# Patient Record
Sex: Male | Born: 1995 | Race: Black or African American | Hispanic: No | Marital: Single | Smoking: Never smoker
Health system: Southern US, Community
[De-identification: ages and names within clinical notes are randomized; demographics above are authoritative.]

## PROBLEM LIST (undated history)

## (undated) DIAGNOSIS — J45909 Unspecified asthma, uncomplicated: Secondary | ICD-10-CM

## (undated) DIAGNOSIS — J302 Other seasonal allergic rhinitis: Secondary | ICD-10-CM

---
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
## 2015-09-23 ENCOUNTER — Encounter (HOSPITAL_COMMUNITY): Payer: Self-pay | Admitting: Emergency Medicine

## 2015-09-23 ENCOUNTER — Emergency Department (HOSPITAL_COMMUNITY)
Admission: EM | Admit: 2015-09-23 | Discharge: 2015-09-24 | Disposition: A | Discharge status: Home / Self Care | Payer: Medicaid Other | Attending: Emergency Medicine | Admitting: Emergency Medicine

## 2015-09-23 ENCOUNTER — Emergency Department (HOSPITAL_COMMUNITY)
Admission: EM | Admit: 2015-09-23 | Discharge: 2015-09-23 | Discharge status: Against Medical Advice | Payer: Medicaid Other | Attending: Emergency Medicine | Admitting: Emergency Medicine

## 2015-09-23 ENCOUNTER — Emergency Department (HOSPITAL_COMMUNITY): Payer: Self-pay

## 2015-09-23 ENCOUNTER — Encounter (HOSPITAL_COMMUNITY): Payer: Self-pay

## 2015-09-23 DIAGNOSIS — R1084 Generalized abdominal pain: Secondary | ICD-10-CM | POA: Insufficient documentation

## 2015-09-23 DIAGNOSIS — J45909 Unspecified asthma, uncomplicated: Secondary | ICD-10-CM | POA: Insufficient documentation

## 2015-09-23 DIAGNOSIS — R110 Nausea: Secondary | ICD-10-CM | POA: Insufficient documentation

## 2015-09-23 DIAGNOSIS — R630 Anorexia: Secondary | ICD-10-CM | POA: Insufficient documentation

## 2015-09-23 DIAGNOSIS — R1031 Right lower quadrant pain: Secondary | ICD-10-CM | POA: Insufficient documentation

## 2015-09-23 DIAGNOSIS — R000 Tachycardia, unspecified: Secondary | ICD-10-CM | POA: Insufficient documentation

## 2015-09-23 DIAGNOSIS — R1032 Left lower quadrant pain: Secondary | ICD-10-CM | POA: Insufficient documentation

## 2015-09-23 DIAGNOSIS — M545 Low back pain: Secondary | ICD-10-CM | POA: Insufficient documentation

## 2015-09-23 LAB — CBC WITH DIFFERENTIAL/PLATELET
Basophils Absolute: 0 10*3/uL (ref 0.0–0.1)
Basophils Relative: 0 %
EOS ABS: 0 10*3/uL (ref 0.0–0.7)
EOS PCT: 0 %
HCT: 38.5 % — ABNORMAL LOW (ref 39.0–52.0)
Hemoglobin: 13.5 g/dL (ref 13.0–17.0)
LYMPHS ABS: 1 10*3/uL (ref 0.7–4.0)
LYMPHS PCT: 7 %
MCH: 29.3 pg (ref 26.0–34.0)
MCHC: 35.1 g/dL (ref 30.0–36.0)
MCV: 83.7 fL (ref 78.0–100.0)
MONO ABS: 1.4 10*3/uL — AB (ref 0.1–1.0)
MONOS PCT: 9 %
Neutro Abs: 13.1 10*3/uL — ABNORMAL HIGH (ref 1.7–7.7)
Neutrophils Relative %: 84 %
PLATELETS: 225 10*3/uL (ref 150–400)
RBC: 4.6 MIL/uL (ref 4.22–5.81)
RDW: 12.6 % (ref 11.5–15.5)
WBC: 15.5 10*3/uL — ABNORMAL HIGH (ref 4.0–10.5)

## 2015-09-23 LAB — COMPREHENSIVE METABOLIC PANEL
ALBUMIN: 4.1 g/dL (ref 3.5–5.0)
ALK PHOS: 57 U/L (ref 38–126)
ALT: 34 U/L (ref 17–63)
AST: 19 U/L (ref 15–41)
Anion gap: 11 (ref 5–15)
BILIRUBIN TOTAL: 1.3 mg/dL — AB (ref 0.3–1.2)
BUN: 8 mg/dL (ref 6–20)
CALCIUM: 9.4 mg/dL (ref 8.9–10.3)
CO2: 29 mmol/L (ref 22–32)
Chloride: 100 mmol/L — ABNORMAL LOW (ref 101–111)
Creatinine, Ser: 0.92 mg/dL (ref 0.61–1.24)
GFR calc Af Amer: >60 mL/min (ref >60)
GFR calc non Af Amer: >60 mL/min (ref >60)
GLUCOSE: 91 mg/dL (ref 65–99)
Potassium: 3.4 mmol/L — ABNORMAL LOW (ref 3.5–5.1)
SODIUM: 140 mmol/L (ref 135–145)
TOTAL PROTEIN: 6.8 g/dL (ref 6.5–8.1)

## 2015-09-23 LAB — CBC
HCT: 39.3 % (ref 39.0–52.0)
HEMOGLOBIN: 13.3 g/dL (ref 13.0–17.0)
MCH: 28.5 pg (ref 26.0–34.0)
MCHC: 33.8 g/dL (ref 30.0–36.0)
MCV: 84.3 fL (ref 78.0–100.0)
PLATELETS: 215 10*3/uL (ref 150–400)
RBC: 4.66 MIL/uL (ref 4.22–5.81)
RDW: 12.7 % (ref 11.5–15.5)
WBC: 14.7 10*3/uL — ABNORMAL HIGH (ref 4.0–10.5)

## 2015-09-23 LAB — I-STAT CHEM 8, ED
BUN: 10 mg/dL (ref 6–20)
CHLORIDE: 99 mmol/L — AB (ref 101–111)
CREATININE: 0.7 mg/dL (ref 0.61–1.24)
Calcium, Ion: 1.12 mmol/L (ref 1.12–1.23)
GLUCOSE: 84 mg/dL (ref 65–99)
HCT: 44 % (ref 39.0–52.0)
Hemoglobin: 15 g/dL (ref 13.0–17.0)
Potassium: 3.4 mmol/L — ABNORMAL LOW (ref 3.5–5.1)
SODIUM: 140 mmol/L (ref 135–145)
TCO2: 28 mmol/L (ref 0–100)

## 2015-09-23 LAB — LIPASE, BLOOD: Lipase: 23 U/L (ref 11–51)

## 2015-09-23 MED ORDER — SODIUM CHLORIDE 0.9 % IV BOLUS (SEPSIS)
1000.0000 mL | Freq: Once | INTRAVENOUS | Status: AC
  Administered 2015-09-23: 1000 mL via INTRAVENOUS

## 2015-09-23 MED ORDER — IOHEXOL 300 MG/ML  SOLN
25.0000 mL | Freq: Once | INTRAMUSCULAR | Status: AC | PRN
  Administered 2015-09-23: 25 mL via ORAL

## 2015-09-23 MED ORDER — MORPHINE SULFATE (PF) 4 MG/ML IV SOLN
4.0000 mg | Freq: Once | INTRAVENOUS | Status: AC
  Administered 2015-09-24: 4 mg via INTRAVENOUS
  Filled 2015-09-23: qty 1

## 2015-09-23 MED ORDER — IOHEXOL 300 MG/ML  SOLN
100.0000 mL | Freq: Once | INTRAMUSCULAR | Status: AC | PRN
  Administered 2015-09-24: 100 mL via INTRAVENOUS

## 2015-09-23 MED ORDER — ONDANSETRON HCL 4 MG/2ML IJ SOLN
4.0000 mg | Freq: Once | INTRAMUSCULAR | Status: AC
  Administered 2015-09-23: 4 mg via INTRAVENOUS
  Filled 2015-09-23: qty 2

## 2015-09-23 NOTE — ED Notes (Signed)
Pt states that he has generalized abdominal pain, lower back pain and bilateral calf pain. Nausea. Denies vomiting or diarrhea. Alert and oriented.

## 2015-09-23 NOTE — ED Provider Notes (Signed)
CSN: [id number]     Arrival date & time 09/23/15  1917 History   First MD Initiated Contact with Patient 09/23/15 2223     Chief Complaint  Patient presents with  . Abdominal Pain     (Consider location/radiation/quality/duration/timing/severity/associated sxs/prior Treatment) HPI   20 year old male here with generalized abdominal pain.  Pt report having lower abd pain radiates to his lower back along with bilateral calf pain since earlier today.  Report feeling nauseous without vomiting or diarrhea.  Report tactile fever without congestion/cough or URI sxs.  No dysuria.  Pt notice urine slightly darker but no cola color.  Report decreased in appetite. Pt took a percocet prior to arrival which has helped.  Pain is currently mild at this time. No penile discharge or hematuria. No recent strenuous activities.  Past Medical History  Diagnosis Date  . Asthma   . Seasonal allergies    History reviewed. No pertinent past surgical history. History reviewed. No pertinent family history. Social History  Substance Use Topics  . Smoking status: Never Smoker   . Smokeless tobacco: None  . Alcohol Use: No    Review of Systems  All other systems reviewed and are negative.     Allergies  Shellfish allergy and Ibuprofen  Home Medications   Prior to Admission medications   Not on File   BP 133/78 mmHg  Pulse 110  Temp(Src) 100 F (37.8 C) (Oral)  Resp 18  SpO2 100% Physical Exam  Constitutional: He appears well-developed and well-nourished. No distress.  HENT:  Head: Atraumatic.  Mouth/Throat: Oropharynx is clear and moist.  Eyes: Conjunctivae are normal.  Neck: Neck supple.  Cardiovascular:  Tachycardia without murmur rubs or gallops  Pulmonary/Chest: Effort normal and breath sounds normal. No respiratory distress. He has no wheezes. He has no rales.  Abdominal: Soft. Bowel sounds are normal. He exhibits no distension. There is tenderness (Mild tenderness to lower abdomen  without focal point tenderness. Negative Murphy sign, no pain at McBurney's point.).  Genitourinary:  Chaperone present during exam. No inguinal lymphadenopathy or hernia noted. Normal circumcised penis free of lesion or rash. Testicles nontender.  Neurological: He is alert.  Skin: No rash noted.  Psychiatric: He has a normal mood and affect.  Nursing note and vitals reviewed.   ED Course  Procedures (including critical care time) Labs Review Labs Reviewed  CBC WITH DIFFERENTIAL/PLATELET - Abnormal; Notable for the following:    WBC 15.5 (*)    HCT 38.5 (*)    Neutro Abs 13.1 (*)    Monocytes Absolute 1.4 (*)    All other components within normal limits  I-STAT CHEM 8, ED - Abnormal; Notable for the following:    Potassium 3.4 (*)    Chloride 99 (*)    All other components within normal limits    Imaging Review Ct Abdomen Pelvis W Contrast  09/24/2015  CLINICAL DATA:  Acute onset of generalized abdominal pain and lower back pain. Nausea and leukocytosis. Initial encounter. EXAM: CT ABDOMEN AND PELVIS WITH CONTRAST TECHNIQUE: Multidetector CT imaging of the abdomen and pelvis was performed using the standard protocol following bolus administration of intravenous contrast. CONTRAST:  OMNIPAQUE IOHEXOL 300 MG/ML  SOLN COMPARISON:  CT of the abdomen and pelvis performed 04/14/2010 FINDINGS: The visualized lung bases are clear. The liver and spleen are unremarkable in appearance. The gallbladder is within normal limits. The pancreas and adrenal glands are unremarkable. The kidneys are unremarkable in appearance. There is no evidence of hydronephrosis.  No renal or ureteral stones are seen. No perinephric stranding is appreciated. No free fluid is identified. The small bowel is unremarkable in appearance. The stomach is within normal limits. No acute vascular abnormalities are seen. Mildly prominent pericecal nodes are seen, nonspecific in appearance. The appendix appears to be normal in  caliber, without evidence of appendicitis. The colon is unremarkable in appearance. The bladder is mildly distended and grossly unremarkable. The prostate remains normal in size. No inguinal lymphadenopathy is seen. No acute osseous abnormalities are identified. IMPRESSION: Unremarkable contrast-enhanced CT of the abdomen and pelvis. Electronically Signed   By: Roanna Raider M.D.   On: 09/24/2015 01:30   I have personally reviewed and evaluated these images and lab results as part of my medical decision-making.   EKG Interpretation None      MDM   Final diagnoses:  Generalized abdominal pain   BP 137/67 mmHg  Pulse 89  Temp(Src) 98.9 F (37.2 C) (Oral)  Resp 18  SpO2 100%   11:08 PM Patient presents with low abdominal pain without peritoneal sign. He however does run an elevated temperature of 100 and has had preliminary blood work done at [facility] ER while patient was waiting earlier today. He does have a leukocytosis with a WBC of 14. I would recheck his labs but given his presentation, abdominal pelvic CT scan ordered to rule out appendicitis.  Care discussed with oncoming provider who will continue to monitor pt and dispo pending CT scan.    Fayrene Helper, PA-C 09/24/15 1404  Shon Baton, MD 09/25/15 [phone]

## 2015-09-23 NOTE — ED Notes (Signed)
Pt reports RLQ and LLQ abdominal pain that radiates to right and left lower back. He reports nausea but has not vomited. He reports lack of appetite and LBM was today.

## 2015-09-24 MED ORDER — ONDANSETRON HCL 4 MG PO TABS: 4.0000 mg | ORAL_TABLET | Freq: Four times a day (QID) | ORAL | Status: DC

## 2015-09-24 MED ORDER — DICYCLOMINE HCL 20 MG PO TABS: 20.0000 mg | ORAL_TABLET | Freq: Two times a day (BID) | ORAL | Status: DC

## 2015-09-24 NOTE — Discharge Instructions (Signed)

## 2015-09-24 NOTE — ED Notes (Signed)
Please see paper chart due to downtown time.

## 2015-12-25 ENCOUNTER — Emergency Department (HOSPITAL_COMMUNITY)
Admission: EM | Admit: 2015-12-25 | Discharge: 2015-12-26 | Disposition: A | Discharge status: Home / Self Care | Payer: Medicaid Other | Attending: Emergency Medicine | Admitting: Emergency Medicine

## 2015-12-25 ENCOUNTER — Encounter (HOSPITAL_COMMUNITY): Payer: Self-pay | Admitting: Emergency Medicine

## 2015-12-25 DIAGNOSIS — Z79899 Other long term (current) drug therapy: Secondary | ICD-10-CM | POA: Diagnosis not present

## 2015-12-25 DIAGNOSIS — Z202 Contact with and (suspected) exposure to infections with a predominantly sexual mode of transmission: Secondary | ICD-10-CM | POA: Diagnosis present

## 2015-12-25 DIAGNOSIS — J45909 Unspecified asthma, uncomplicated: Secondary | ICD-10-CM | POA: Diagnosis not present

## 2015-12-25 DIAGNOSIS — A64 Unspecified sexually transmitted disease: Secondary | ICD-10-CM | POA: Insufficient documentation

## 2015-12-25 MED ORDER — CEFTRIAXONE SODIUM 250 MG IJ SOLR
250.0000 mg | Freq: Once | INTRAMUSCULAR | Status: AC
  Administered 2015-12-25: 250 mg via INTRAMUSCULAR
  Filled 2015-12-25: qty 250

## 2015-12-25 MED ORDER — AZITHROMYCIN 1 G PO PACK
1.0000 g | PACK | Freq: Once | ORAL | Status: AC
  Administered 2015-12-25: 1 g via ORAL
  Filled 2015-12-25: qty 1

## 2015-12-25 MED ORDER — STERILE WATER FOR INJECTION IJ SOLN
INTRAMUSCULAR | Status: AC
  Administered 2015-12-25: 0.9 mL
  Filled 2015-12-25: qty 10

## 2015-12-25 NOTE — ED Provider Notes (Signed)
CSN: [id number]     Arrival date & time 12/25/15  2143 History   First MD Initiated Contact with Patient 12/25/15 2303     Chief Complaint  Patient presents with  . STD check      (Consider location/radiation/quality/duration/timing/severity/associated sxs/prior Treatment) HPI Comments: Patient here after being told that he has been exposed to an STD. He denies any dysuria or penile drainage. No abdominal discomfort. He is currently asymptomatic. He had unprotected sex month ago. Notably use prior to arrival  The history is provided by the patient.    Past Medical History  Diagnosis Date  . Asthma   . Seasonal allergies    History reviewed. No pertinent past surgical history. History reviewed. No pertinent family history. Social History  Substance Use Topics  . Smoking status: Never Smoker   . Smokeless tobacco: None  . Alcohol Use: Yes     Comment: occ    Review of Systems  All other systems reviewed and are negative.     Allergies  Shellfish allergy and Ibuprofen  Home Medications   Prior to Admission medications   Medication Sig Start Date End Date Taking? Authorizing Provider  dicyclomine (BENTYL) 20 MG tablet Take 1 tablet (20 mg total) by mouth 2 (two) times daily. 09/24/15   Tiffany Neva Seat, PA-C  ondansetron (ZOFRAN) 4 MG tablet Take 1 tablet (4 mg total) by mouth every 6 (six) hours. 09/24/15   Tiffany Neva Seat, PA-C   BP 110/86 mmHg  Pulse 86  Temp(Src) 98.3 F (36.8 C) (Oral)  Resp 18  Ht  (1.905 m)  Wt 88.451 kg  BMI 24.37 kg/m2  SpO2 96% Physical Exam  Constitutional: He is oriented to person, place, and time. He appears well-developed and well-nourished.  Non-toxic appearance. No distress.  HENT:  Head: Normocephalic and atraumatic.  Eyes: Conjunctivae, EOM and lids are normal. Pupils are equal, round, and reactive to light.  Neck: Normal range of motion. Neck supple. No tracheal deviation present. No thyroid mass present.  Cardiovascular:  Normal rate, regular rhythm and normal heart sounds.  Exam reveals no gallop.   No murmur heard. Pulmonary/Chest: Effort normal and breath sounds normal. No stridor. No respiratory distress. He has no decreased breath sounds. He has no wheezes. He has no rhonchi. He has no rales.  Abdominal: Soft. Normal appearance and bowel sounds are normal. He exhibits no distension. There is no tenderness. There is no rebound and no CVA tenderness.  Genitourinary: Testes normal. Circumcised.  Musculoskeletal: Normal range of motion. He exhibits no edema or tenderness.  Neurological: He is alert and oriented to person, place, and time. He has normal strength. No cranial nerve deficit or sensory deficit. GCS eye subscore is 4. GCS verbal subscore is 5. GCS motor subscore is 6.  Skin: Skin is warm and dry. No abrasion and no rash noted.  Psychiatric: He has a normal mood and affect. His speech is normal and behavior is normal.  Nursing note and vitals reviewed.   ED Course  Procedures (including critical care time) Labs Review Labs Reviewed  HIV ANTIBODY (ROUTINE TESTING)  RPR  GC/CHLAMYDIA PROBE AMP ([place] ) NOT AT [facility]    Imaging Review No results found. I have personally reviewed and evaluated these images and lab results as part of my medical decision-making.   EKG Interpretation None      MDM   Final diagnoses:  None    Patient treated for GC chlamydia. Follow-up given    Lorre Nick,  MD 12/25/15 2313

## 2015-12-25 NOTE — Discharge Instructions (Signed)
Sexually Transmitted Disease °A sexually transmitted disease (STD) is a disease or infection that may be passed (transmitted) from person to person, usually during sexual activity. This may happen by way of saliva, semen, blood, vaginal mucus, or urine. Common STDs include: °· Gonorrhea. °· Chlamydia. °· Syphilis. °· HIV and AIDS. °· Genital herpes. °· Hepatitis B and C. °· Trichomonas. °· Human papillomavirus (HPV). °· Pubic lice. °· Scabies. °· Mites. °· Bacterial vaginosis. °WHAT ARE CAUSES OF STDs? °An STD may be caused by bacteria, a virus, or parasites. STDs are often transmitted during sexual activity if one person is infected. However, they may also be transmitted through nonsexual means. STDs may be transmitted after:  °· Sexual intercourse with an infected person. °· Sharing sex toys with an infected person. °· Sharing needles with an infected person or using unclean piercing or tattoo needles. °· Having intimate contact with the genitals, mouth, or rectal areas of an infected person. °· Exposure to infected fluids during birth. °WHAT ARE THE SIGNS AND SYMPTOMS OF STDs? °Different STDs have different symptoms. Some people may not have any symptoms. If symptoms are present, they may include: °· Painful or bloody urination. °· Pain in the pelvis, abdomen, vagina, anus, throat, or eyes. °· A skin rash, itching, or irritation. °· Growths, ulcerations, blisters, or sores in the genital and anal areas. °· Abnormal vaginal discharge with or without bad odor. °· Penile discharge in men. °· Fever. °· Pain or bleeding during sexual intercourse. °· Swollen glands in the groin area. °· Yellow skin and eyes (jaundice). This is seen with hepatitis. °· Swollen testicles. °· Infertility. °· Sores and blisters in the mouth. °HOW ARE STDs DIAGNOSED? °To make a diagnosis, your health care provider may: °· Take a medical history. °· Perform a physical exam. °· Take a sample of any discharge to examine. °· Swab the throat,  cervix, opening to the penis, rectum, or vagina for testing. °· Test a sample of your first morning urine. °· Perform blood tests. °· Perform a Pap test, if this applies. °· Perform a colposcopy. °· Perform a laparoscopy. °HOW ARE STDs TREATED? °Treatment depends on the STD. Some STDs may be treated but not cured. °· Chlamydia, gonorrhea, trichomonas, and syphilis can be cured with antibiotic medicine. °· Genital herpes, hepatitis, and HIV can be treated, but not cured, with prescribed medicines. The medicines lessen symptoms. °· Genital warts from HPV can be treated with medicine or by freezing, burning (electrocautery), or surgery. Warts may come back. °· HPV cannot be cured with medicine or surgery. However, abnormal areas may be removed from the cervix, vagina, or vulva. °· If your diagnosis is confirmed, your recent sexual partners need treatment. This is true even if they are symptom-free or have a negative culture or evaluation. They should not have sex until their health care providers say it is okay. °· Your health care provider may test you for infection again 3 months after treatment. °HOW CAN I REDUCE MY RISK OF GETTING AN STD? °Take these steps to reduce your risk of getting an STD: °· Use latex condoms, dental dams, and water-soluble lubricants during sexual activity. Do not use petroleum jelly or oils. °· Avoid having multiple sex partners. °· Do not have sex with someone who has other sex partners °· Do not have sex with anyone you do not know or who is at high risk for an STD. °· Avoid risky sex practices that can break your skin. °· Do not have sex   if you have open sores on your mouth or skin. °· Avoid drinking too much alcohol or taking illegal drugs. Alcohol and drugs can affect your judgment and put you in a vulnerable position. °· Avoid engaging in oral and anal sex acts. °· Get vaccinated for HPV and hepatitis. If you have not received these vaccines in the past, talk to your health care  provider about whether one or both might be right for you. °· If you are at risk of being infected with HIV, it is recommended that you take a prescription medicine daily to prevent HIV infection. This is called pre-exposure prophylaxis (PrEP). You are considered at risk if: °¨ You are a man who has sex with other men (MSM). °¨ You are a heterosexual man or woman and are sexually active with more than one partner. °¨ You take drugs by injection. °¨ You are sexually active with a partner who has HIV. °· Talk with your health care provider about whether you are at high risk of being infected with HIV. If you choose to begin PrEP, you should first be tested for HIV. You should then be tested every 3 months for as long as you are taking PrEP. °WHAT SHOULD I DO IF I THINK I HAVE AN STD? °· See your health care provider. °· Tell your sexual partner(s). They should be tested and treated for any STDs. °· Do not have sex until your health care provider says it is okay. °WHEN SHOULD I GET IMMEDIATE MEDICAL CARE? °Contact your health care provider right away if:  °· You have severe abdominal pain. °· You are a man and notice swelling or pain in your testicles. °· You are a woman and notice swelling or pain in your vagina. °  °This information is not intended to replace advice given to you by your health care provider. Make sure you discuss any questions you have with your health care provider. °  °Document Released: 10/23/2002 Document Revised: 08/23/2014 Document Reviewed: 02/20/2013 °Elsevier Interactive Patient Education ©2016 [employer] ° °
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
## 2015-12-26 LAB — RPR: RPR Ser Ql: NONREACTIVE

## 2015-12-26 LAB — HIV ANTIBODY (ROUTINE TESTING W REFLEX): HIV Screen 4th Generation wRfx: NONREACTIVE

## 2016-01-12 ENCOUNTER — Emergency Department (HOSPITAL_COMMUNITY)
Admission: EM | Admit: 2016-01-12 | Discharge: 2016-01-13 | Disposition: A | Discharge status: Home / Self Care | Payer: Medicaid Other | Attending: Emergency Medicine | Admitting: Emergency Medicine

## 2016-01-12 ENCOUNTER — Encounter (HOSPITAL_COMMUNITY): Payer: Self-pay | Admitting: *Deleted

## 2016-01-12 DIAGNOSIS — Y999 Unspecified external cause status: Secondary | ICD-10-CM | POA: Insufficient documentation

## 2016-01-12 DIAGNOSIS — Y929 Unspecified place or not applicable: Secondary | ICD-10-CM | POA: Insufficient documentation

## 2016-01-12 DIAGNOSIS — S022XXA Fracture of nasal bones, initial encounter for closed fracture: Secondary | ICD-10-CM | POA: Diagnosis not present

## 2016-01-12 DIAGNOSIS — S01512A Laceration without foreign body of oral cavity, initial encounter: Secondary | ICD-10-CM | POA: Diagnosis not present

## 2016-01-12 DIAGNOSIS — S0083XA Contusion of other part of head, initial encounter: Secondary | ICD-10-CM

## 2016-01-12 DIAGNOSIS — J45909 Unspecified asthma, uncomplicated: Secondary | ICD-10-CM | POA: Insufficient documentation

## 2016-01-12 DIAGNOSIS — Y939 Activity, unspecified: Secondary | ICD-10-CM | POA: Insufficient documentation

## 2016-01-12 DIAGNOSIS — S0992XA Unspecified injury of nose, initial encounter: Secondary | ICD-10-CM | POA: Diagnosis present
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
## 2016-01-13 ENCOUNTER — Emergency Department (HOSPITAL_COMMUNITY): Payer: Medicaid Other

## 2016-01-13 MED ORDER — AMOXICILLIN 500 MG PO CAPS
1000.0000 mg | ORAL_CAPSULE | Freq: Once | ORAL | Status: AC
  Administered 2016-01-13: 1000 mg via ORAL
  Filled 2016-01-13: qty 2

## 2016-01-13 MED ORDER — TRAMADOL HCL 50 MG PO TABS: 50.0000 mg | ORAL_TABLET | Freq: Four times a day (QID) | ORAL | Status: DC | PRN

## 2016-01-13 MED ORDER — AMOXICILLIN 875 MG PO TABS: 875.0000 mg | ORAL_TABLET | Freq: Two times a day (BID) | ORAL | Status: DC
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
## 2016-01-13 NOTE — Discharge Instructions (Signed)
If your facial swelling gets worse, you start running a fever, or you start having pus draining from the cut on the inside your mouth, then returned to the emergency department for reevaluation.  Contusion A contusion is a deep bruise. Contusions are the result of a blunt injury to tissues and muscle fibers under the skin. The injury causes bleeding under the skin. The skin overlying the contusion may turn blue, purple, or yellow. Minor injuries will give you a painless contusion, but more severe contusions may stay painful and swollen for a few weeks.  CAUSES  This condition is usually caused by a blow, trauma, or direct force to an area of the body. SYMPTOMS  Symptoms of this condition include:  Swelling of the injured area.  Pain and tenderness in the injured area.  Discoloration. The area may have redness and then turn blue, purple, or yellow. DIAGNOSIS  This condition is diagnosed based on a physical exam and medical history. An X-ray, CT scan, or MRI may be needed to determine if there are any associated injuries, such as broken bones (fractures). TREATMENT  Specific treatment for this condition depends on what area of the body was injured. In general, the best treatment for a contusion is resting, icing, applying pressure to (compression), and elevating the injured area. This is often called the RICE strategy. Over-the-counter anti-inflammatory medicines may also be recommended for pain control.  HOME CARE INSTRUCTIONS   Rest the injured area.  If directed, apply ice to the injured area:  Put ice in a plastic bag.  Place a towel between your skin and the bag.  Leave the ice on for 20 minutes, 2-3 times per day.  If directed, apply light compression to the injured area using an elastic bandage. Make sure the bandage is not wrapped too tightly. Remove and reapply the bandage as directed by your health care provider.  If possible, raise (elevate) the injured area above the level  of your heart while you are sitting or lying down.  Take over-the-counter and prescription medicines only as told by your health care provider. SEEK MEDICAL CARE IF:  Your symptoms do not improve after several days of treatment.  Your symptoms get worse.  You have difficulty moving the injured area. SEEK IMMEDIATE MEDICAL CARE IF:   You have severe pain.  You have numbness in a hand or foot.  Your hand or foot turns pale or cold.   This information is not intended to replace advice given to you by your health care provider. Make sure you discuss any questions you have with your health care provider.   Document Released: 05/12/2005 Document Revised: 04/23/2015 Document Reviewed: 12/18/2014 Elsevier Interactive Patient Education 2016 Elsevier Inc.  Nasal Fracture A nasal fracture is a break or crack in the bones or cartilage of the nose. Minor breaks do not require treatment. These breaks usually heal on their own after about one month. Serious breaks may require surgery. CAUSES This injury is usually caused by a blunt injury to the nose. This type of injury often occurs from:  Contact sports.  Car accidents.  Falls.  Getting punched. SYMPTOMS Symptoms of this injury include:  Pain.  Swelling of the nose.  Bleeding from the nose.  Bruising around the nose or eyes. This may include having black eyes.  Crooked appearance of the nose. DIAGNOSIS This injury may be diagnosed with a physical exam. The health care provider will gently feel the nose for signs of broken bones. He  or she will look inside the nostrils to make sure that there is not a blood-filled swelling on the dividing wall between the nostrils (septal hematoma). X-rays of the nose may not show a nasal fracture even when one is present. In some cases, X-rays or a CT scan may be done 1-5 days after the injury. Sometimes, the health care provider will want to wait until the swelling has gone  down. TREATMENT Often, minor fractures that have caused no deformity do not require treatment. More serious fractures in which bones have moved out of position may require surgery, which will take place after the swelling is gone. Surgery will stabilize and align the fracture. In some cases, a health care provider may be able to reposition the bones without surgery. This may be done in the health care provider's office after medicine is given to numb the area (local anesthetic). HOME CARE INSTRUCTIONS  If directed, apply ice to the injured area:  Put ice in a plastic bag.  Place a towel between your skin and the bag.  Leave the ice on for 20 minutes, 2-3 times per day.  Take over-the-counter and prescription medicines only as told by your health care provider.  If your nose starts to bleed, sit in an upright position while you squeeze the soft parts of your nose against the dividing wall between your nostrils (septum) for 10 minutes.  Try to avoid blowing your nose.  Return to your normal activities as told by your health care provider. Ask your health care provider what activities are safe for you.  Avoid contact sports for 3-4 weeks or as told by your health care provider.  Keep all follow-up visits as told by your health care provider. This is important. SEEK MEDICAL CARE IF:  Your pain increases or becomes severe.  You continue to have nosebleeds.  The shape of your nose does not return to normal within 5 days.  You have pus draining out of your nose. SEEK IMMEDIATE MEDICAL CARE IF:  You have bleeding from your nose that does not stop after you pinch your nostrils closed for 20 minutes and keep ice on your nose.  You have clear fluid draining out of your nose.  You notice a grape-like swelling on the septum. This swelling is a collection of blood (hematoma) that must be drained to help prevent infection.  You have difficulty moving your eyes.  You have repeated  vomiting.   This information is not intended to replace advice given to you by your health care provider. Make sure you discuss any questions you have with your health care provider.   Document Released: 07/30/2000 Document Revised: 04/23/2015 Document Reviewed: 09/09/2014 Elsevier Interactive Patient Education 2016 Elsevier Inc.  Amoxicillin capsules or tablets What is this medicine? AMOXICILLIN (a mox i SIL in) is a penicillin antibiotic. It is used to treat certain kinds of bacterial infections. It will not work for colds, flu, or other viral infections. This medicine may be used for other purposes; ask your health care provider or pharmacist if you have questions. What should I tell my health care provider before I take this medicine? They need to know if you have any of these conditions: -asthma -kidney disease -an unusual or allergic reaction to amoxicillin, other penicillins, cephalosporin antibiotics, other medicines, foods, dyes, or preservatives -pregnant or trying to get pregnant -breast-feeding How should I use this medicine? Take this medicine by mouth with a glass of water. Follow the directions on your prescription  label. You may take this medicine with food or on an empty stomach. Take your medicine at regular intervals. Do not take your medicine more often than directed. Take all of your medicine as directed even if you think your are better. Do not skip doses or stop your medicine early. Talk to your pediatrician regarding the use of this medicine in children. While this drug may be prescribed for selected conditions, precautions do apply. Overdosage: If you think you have taken too much of this medicine contact a poison control center or emergency room at once. NOTE: This medicine is only for you. Do not share this medicine with others. What if I miss a dose? If you miss a dose, take it as soon as you can. If it is almost time for your next dose, take only that dose. Do  not take double or extra doses. What may interact with this medicine? -amiloride -birth control pills -chloramphenicol -macrolides -probenecid -sulfonamides -tetracyclines This list may not describe all possible interactions. Give your health care provider a list of all the medicines, herbs, non-prescription drugs, or dietary supplements you use. Also tell them if you smoke, drink alcohol, or use illegal drugs. Some items may interact with your medicine. What should I watch for while using this medicine? Tell your doctor or health care professional if your symptoms do not improve in 2 or 3 days. Take all of the doses of your medicine as directed. Do not skip doses or stop your medicine early. If you are diabetic, you may get a false positive result for sugar in your urine with certain brands of urine tests. Check with your doctor. Do not treat diarrhea with over-the-counter products. Contact your doctor if you have diarrhea that lasts more than 2 days or if the diarrhea is severe and watery. What side effects may I notice from receiving this medicine? Side effects that you should report to your doctor or health care professional as soon as possible: -allergic reactions like skin rash, itching or hives, swelling of the face, lips, or tongue -breathing problems -dark urine -redness, blistering, peeling or loosening of the skin, including inside the mouth -seizures -severe or watery diarrhea -trouble passing urine or change in the amount of urine -unusual bleeding or bruising -unusually weak or tired -yellowing of the eyes or skin Side effects that usually do not require medical attention (report to your doctor or health care professional if they continue or are bothersome): -dizziness -headache -stomach upset -trouble sleeping This list may not describe all possible side effects. Call your doctor for medical advice about side effects. You may report side effects to FDA at  1-800-FDA-1088. Where should I keep my medicine? Keep out of the reach of children. Store between 68 and 77 degrees F (20 and 25 degrees C). Keep bottle closed tightly. Throw away any unused medicine after the expiration date. NOTE: This sheet is a summary. It may not cover all possible information. If you have questions about this medicine, talk to your doctor, pharmacist, or health care provider.    2016, Elsevier/Gold Standard. (2007-10-24 14:10:59)  Tramadol tablets What is this medicine? TRAMADOL (TRA ma dole) is a pain reliever. It is used to treat moderate to severe pain in adults. This medicine may be used for other purposes; ask your health care provider or pharmacist if you have questions. What should I tell my health care provider before I take this medicine? They need to know if you have any of these conditions: -brain  tumor -depression -drug abuse or addiction -head injury -if you frequently drink alcohol containing drinks -kidney disease or trouble passing urine -liver disease -lung disease, asthma, or breathing problems -seizures or epilepsy -suicidal thoughts, plans, or attempt; a previous suicide attempt by you or a family member -an unusual or allergic reaction to tramadol, codeine, other medicines, foods, dyes, or preservatives -pregnant or trying to get pregnant -breast-feeding How should I use this medicine? Take this medicine by mouth with a full glass of water. Follow the directions on the prescription label. If the medicine upsets your stomach, take it with food or milk. Do not take more medicine than you are told to take. Talk to your pediatrician regarding the use of this medicine in children. Special care may be needed. Overdosage: If you think you have taken too much of this medicine contact a poison control center or emergency room at once. NOTE: This medicine is only for you. Do not share this medicine with others. What if I miss a dose? If you miss a  dose, take it as soon as you can. If it is almost time for your next dose, take only that dose. Do not take double or extra doses. What may interact with this medicine? Do not take this medicine with any of the following medications: -MAOIs like Carbex, Eldepryl, Marplan, Nardil, and Parnate This medicine may also interact with the following medications: -alcohol or medicines that contain alcohol -antihistamines -benzodiazepines -bupropion -carbamazepine or oxcarbazepine -clozapine -cyclobenzaprine -digoxin -furazolidone -linezolid -medicines for depression, anxiety, or psychotic disturbances -medicines for migraine headache like almotriptan, eletriptan, frovatriptan, naratriptan, rizatriptan, sumatriptan, zolmitriptan -medicines for pain like pentazocine, buprenorphine, butorphanol, meperidine, nalbuphine, and propoxyphene -medicines for sleep -muscle relaxants -naltrexone -phenobarbital -phenothiazines like perphenazine, thioridazine, chlorpromazine, mesoridazine, fluphenazine, prochlorperazine, promazine, and trifluoperazine -procarbazine -warfarin This list may not describe all possible interactions. Give your health care provider a list of all the medicines, herbs, non-prescription drugs, or dietary supplements you use. Also tell them if you smoke, drink alcohol, or use illegal drugs. Some items may interact with your medicine. What should I watch for while using this medicine? Tell your doctor or health care professional if your pain does not go away, if it gets worse, or if you have new or a different type of pain. You may develop tolerance to the medicine. Tolerance means that you will need a higher dose of the medicine for pain relief. Tolerance is normal and is expected if you take this medicine for a long time. Do not suddenly stop taking your medicine because you may develop a severe reaction. Your body becomes used to the medicine. This does NOT mean you are addicted.  Addiction is a behavior related to getting and using a drug for a non-medical reason. If you have pain, you have a medical reason to take pain medicine. Your doctor will tell you how much medicine to take. If your doctor wants you to stop the medicine, the dose will be slowly lowered over time to avoid any side effects. You may get drowsy or dizzy. Do not drive, use machinery, or do anything that needs mental alertness until you know how this medicine affects you. Do not stand or sit up quickly, especially if you are an older patient. This reduces the risk of dizzy or fainting spells. Alcohol can increase or decrease the effects of this medicine. Avoid alcoholic drinks. You may have constipation. Try to have a bowel movement at least every 2 to 3 days. If you do  not have a bowel movement for 3 days, call your doctor or health care professional. Your mouth may get dry. Chewing sugarless gum or sucking hard candy, and drinking plenty of water may help. Contact your doctor if the problem does not go away or is severe. What side effects may I notice from receiving this medicine? Side effects that you should report to your doctor or health care professional as soon as possible: -allergic reactions like skin rash, itching or hives, swelling of the face, lips, or tongue -breathing difficulties, wheezing -confusion -itching -light headedness or fainting spells -redness, blistering, peeling or loosening of the skin, including inside the mouth -seizures Side effects that usually do not require medical attention (report to your doctor or health care professional if they continue or are bothersome): -constipation -dizziness -drowsiness -headache -nausea, vomiting This list may not describe all possible side effects. Call your doctor for medical advice about side effects. You may report side effects to FDA at 1-800-FDA-1088. Where should I keep my medicine? Keep out of the reach of children. This medicine  may cause accidental overdose and death if it taken by other adults, children, or pets. Mix any unused medicine with a substance like cat litter or coffee grounds. Then throw the medicine away in a sealed container like a sealed bag or a coffee can with a lid. Do not use the medicine after the expiration date. Store at room temperature between 15 and 30 degrees C (59 and 86 degrees F). NOTE: This sheet is a summary. It may not cover all possible information. If you have questions about this medicine, talk to your doctor, pharmacist, or health care provider.    2016, Elsevier/Gold Standard. (2013-09-28 15:42:09)

## 2016-04-16 DIAGNOSIS — J45909 Unspecified asthma, uncomplicated: Secondary | ICD-10-CM | POA: Insufficient documentation

## 2016-04-16 DIAGNOSIS — R109 Unspecified abdominal pain: Secondary | ICD-10-CM | POA: Diagnosis not present

## 2016-04-16 DIAGNOSIS — J029 Acute pharyngitis, unspecified: Secondary | ICD-10-CM | POA: Insufficient documentation

## 2016-04-16 DIAGNOSIS — R070 Pain in throat: Secondary | ICD-10-CM | POA: Diagnosis present

## 2016-04-16 DIAGNOSIS — F1290 Cannabis use, unspecified, uncomplicated: Secondary | ICD-10-CM | POA: Diagnosis not present

## 2016-04-16 DIAGNOSIS — Z79899 Other long term (current) drug therapy: Secondary | ICD-10-CM | POA: Diagnosis not present

## 2016-04-17 ENCOUNTER — Emergency Department (HOSPITAL_COMMUNITY)
Admission: EM | Admit: 2016-04-17 | Discharge: 2016-04-17 | Disposition: A | Discharge status: Home / Self Care | Payer: Medicaid Other | Attending: Emergency Medicine | Admitting: Emergency Medicine

## 2016-04-17 ENCOUNTER — Encounter (HOSPITAL_COMMUNITY): Payer: Self-pay | Admitting: Emergency Medicine

## 2016-04-17 DIAGNOSIS — J029 Acute pharyngitis, unspecified: Secondary | ICD-10-CM

## 2016-04-17 DIAGNOSIS — R590 Localized enlarged lymph nodes: Secondary | ICD-10-CM

## 2016-04-17 LAB — RAPID STREP SCREEN (MED CTR MEBANE ONLY): Streptococcus, Group A Screen (Direct): NEGATIVE

## 2016-04-17 MED ORDER — LIDOCAINE VISCOUS 2 % MT SOLN
15.0000 mL | Freq: Once | OROMUCOSAL | Status: AC
  Administered 2016-04-17: 15 mL via OROMUCOSAL
  Filled 2016-04-17: qty 15

## 2016-04-17 MED ORDER — ACETAMINOPHEN 325 MG PO TABS
650.0000 mg | ORAL_TABLET | Freq: Once | ORAL | Status: AC
  Administered 2016-04-17: 650 mg via ORAL
  Filled 2016-04-17: qty 2

## 2016-04-17 MED ORDER — DEXAMETHASONE 10 MG/ML FOR PEDIATRIC ORAL USE
10.0000 mg | Freq: Once | INTRAMUSCULAR | Status: AC
  Administered 2016-04-17: 10 mg via ORAL
  Filled 2016-04-17: qty 1

## 2016-04-17 NOTE — ED Triage Notes (Signed)
Pt c/o severe sore throat with voice changes x 2-3 days, swelling noted to R side of neck,  upper abd pain, nausea and fever. Pt has not taken anything for pain or fever

## 2016-04-17 NOTE — ED Provider Notes (Signed)
[facility]-EMERGENCY DEPT Provider Note   CSN: [id number] Arrival date & time: 04/16/16  2351  By signing my name below, I, Roberto Castro, attest that this documentation has been prepared under the direction and in the presence of Roberto Munchobert Ever Halberg, MD . Electronically Signed: Nelwyn SalisburyJoshua Castro, Scribe. 04/17/2016. 12:36 AM.   History   Chief Complaint Chief Complaint  Patient presents with  . Abdominal Pain    The history is provided by the patient. No language interpreter was used.     HPI Comments:  Roberto Castro is a 20 y.o. male with no pertinent PMHx who presents to the Emergency Department complaining of sudden-onset constant right-sided throat pain onset 2-3 days ago. Pt reports associated diffuse body aches and fever coinciding with the throat pain. He denies any chest pain or shortness of breath. Pt has taken tylenol with mild relief.  Past Medical History:  Diagnosis Date  . Asthma   . Seasonal allergies     There are no active problems to display for this patient.   History reviewed. No pertinent surgical history.   Home Medications    Prior to Admission medications   Medication Sig Start Date End Date Taking? Authorizing Provider  acetaminophen (TYLENOL) 500 MG tablet Take 1,000 mg by mouth every 6 (six) hours as needed for mild pain.   Yes Historical Provider, MD  amoxicillin (AMOXIL) 875 MG tablet Take 1 tablet (875 mg total) by mouth 2 (two) times daily. Patient not taking: Reported on 04/17/2016 01/13/16   Roberto Boozeavid Glick, MD  traMADol (ULTRAM) 50 MG tablet Take 1 tablet (50 mg total) by mouth every 6 (six) hours as needed. Patient not taking: Reported on 04/17/2016 01/13/16   Roberto Boozeavid Glick, MD    Family History No family history on file.  Social History Social History  Substance Use Topics  . Smoking status: Never Smoker  . Smokeless tobacco: Never Used  . Alcohol use Yes     Comment: occ     Allergies   Shellfish allergy; Benadryl [diphenhydramine]; and  Ibuprofen   Review of Systems Review of Systems  Gastrointestinal: Positive for abdominal pain.     Physical Exam Updated Vital Signs BP (!) 154/128 (BP Location: Left Arm)   Pulse 98   Temp 102.7 F (39.3 C) (Oral)   Resp 13   Ht 6\' 3"  (1.905 m)   Wt 200 lb (90.7 kg)   SpO2 93%   BMI 25.00 kg/m   Physical Exam  Constitutional: He is oriented to person, place, and time. He appears well-developed. No distress.  HENT:  Head: Normocephalic and atraumatic.  Eyes: Conjunctivae and EOM are normal.  Neck:  Right inferior mandibular mid-cervical lymph node swollen. Posterior orpharynx w/o obvious exudate or asym  Cardiovascular: Normal rate and regular rhythm.   Pulmonary/Chest: Effort normal. No stridor. No respiratory distress.  Abdominal: He exhibits no distension.  Musculoskeletal: He exhibits no edema.  Neurological: He is alert and oriented to person, place, and time.  Skin: Skin is warm and dry.  Psychiatric: He has a normal mood and affect.  Nursing note and vitals reviewed.    ED Treatments / Results  DIAGNOSTIC STUDIES:  Oxygen Saturation is 93% on RA, low by my interpretation.    COORDINATION OF CARE:  12:35 AM Discussed treatment plan with pt at bedside and pt agreed to plan.  Labs (all labs ordered are listed, but only abnormal results are displayed) Labs Reviewed - No data to display   2:34 AM Patient  feeling substantially better, requests discharge.  Procedures Procedures (including critical care time)  Medications Ordered in ED Medications  acetaminophen (TYLENOL) tablet 650 mg (650 mg Oral Given 04/17/16 0019)     Initial Impression / Assessment and Plan / ED Course  I have reviewed the triage vital signs and the nursing notes.  Pertinent labs & imaging results that were available during my care of the patient were reviewed by me and considered in my medical decision making (see chart for details).  I personally performed the services  described in this documentation, which was scribed in my presence. The recorded information has been reviewed and is accurate.   Young male presents with sore throat, son have cervical adenopathy, no evidence for oropharyngeal compromise. Initial strep test negative, patient improved substantially, and with no evidence for bacteremia, sepsis, acute other pathology, the patient was discharged with outpatient follow-up, ENT follow-up.     Roberto Munch, MD 04/17/16 [phone]

## 2016-04-17 NOTE — Discharge Instructions (Signed)
As discussed, your evaluation today has been largely reassuring.  But, it is important that you monitor your condition carefully, and do not hesitate to return to the ED if you develop new, or concerning changes in your condition.  Otherwise, please follow-up with your physician for appropriate ongoing care.  A secondary test is being performed to ensure that you do not have strep throat. If you receive a phone call, regarding abnormal results, you'll be instructed on how to proceed.

## 2016-04-19 LAB — CULTURE, GROUP A STREP (THRC)

## 2016-08-14 ENCOUNTER — Encounter (HOSPITAL_COMMUNITY): Payer: Self-pay | Admitting: *Deleted

## 2016-08-14 ENCOUNTER — Ambulatory Visit (HOSPITAL_COMMUNITY)
Admission: EM | Admit: 2016-08-14 | Discharge: 2016-08-14 | Disposition: A | Discharge status: Home / Self Care | Payer: Medicaid Other | Attending: Emergency Medicine | Admitting: Emergency Medicine

## 2016-08-14 DIAGNOSIS — K529 Noninfective gastroenteritis and colitis, unspecified: Secondary | ICD-10-CM | POA: Diagnosis not present

## 2016-08-14 MED ORDER — ONDANSETRON HCL 4 MG PO TABS: 4.0000 mg | ORAL_TABLET | Freq: Three times a day (TID) | ORAL | 0 refills | Status: AC | PRN

## 2016-08-14 NOTE — ED Provider Notes (Signed)
[facility]    CSN: [id number] Arrival date & time: 08/14/16  1559     History   Chief Complaint Chief Complaint  Patient presents with  . Abdominal Cramping  . Vomiting  . Diarrhea    HPI Roberto Castro is a 20 y.o. male.   HPI  He is a 20 year old man here for evaluation of vomiting and diarrhea. He reports a 2 to three-day history of intermittent abdominal cramping, nausea, vomiting, and diarrhea. He is having 3-5 watery stools a day. He has not vomited since yesterday. He is able to tolerate liquids, but tends to vomit after eating food. No fevers. There are sick contacts at work. No concern for food poisoning.  Past Medical History:  Diagnosis Date  . Asthma   . Seasonal allergies     There are no active problems to display for this patient.   History reviewed. No pertinent surgical history.     Home Medications    Prior to Admission medications   Medication Sig Start Date End Date Taking? Authorizing Provider  acetaminophen (TYLENOL) 500 MG tablet Take 1,000 mg by mouth every 6 (six) hours as needed for mild pain.    Historical Provider, MD  ondansetron (ZOFRAN) 4 MG tablet Take 1 tablet (4 mg total) by mouth every 8 (eight) hours as needed for nausea or vomiting. 08/14/16   Charm RingsErin J Lexii Walsh, MD    Family History History reviewed. No pertinent family history.  Social History Social History  Substance Use Topics  . Smoking status: Never Smoker  . Smokeless tobacco: Never Used  . Alcohol use Yes     Comment: occ     Allergies   Shellfish allergy; Benadryl [diphenhydramine]; and Ibuprofen   Review of Systems Review of Systems As in history of present illness  Physical Exam Triage Vital Signs ED Triage Vitals [08/14/16 1753]  Enc Vitals Group     BP 154/86     Pulse Rate 69     Resp 17     Temp 98.3 F (36.8 C)     Temp Source Oral     SpO2 100 %     Weight      Height      Head Circumference      Peak Flow      Pain Score 3       Pain Loc      Pain Edu?      Excl. in GC?    No data found.   Updated Vital Signs BP 154/86 (BP Location: Right Arm)   Pulse 69   Temp 98.3 F (36.8 C) (Oral)   Resp 17   SpO2 100%   Visual Acuity Right Eye Distance:   Left Eye Distance:   Bilateral Distance:    Right Eye Near:   Left Eye Near:    Bilateral Near:     Physical Exam  Constitutional: He is oriented to person, place, and time. He appears well-developed and well-nourished. No distress.  HENT:  Mouth/Throat: Oropharynx is clear and moist.  Cardiovascular: Normal rate, regular rhythm and normal heart sounds.   No murmur heard. Pulmonary/Chest: Effort normal.  Abdominal: Soft. He exhibits no distension. There is tenderness (minimal tenderness in periumbilical). There is no rebound and no guarding.  Hyperactive bowel sounds  Neurological: He is alert and oriented to person, place, and time.     UC Treatments / Results  Labs (all labs ordered are listed, but only abnormal results are  displayed) Labs Reviewed - No data to display  EKG  EKG Interpretation None       Radiology No results found.  Procedures Procedures (including critical care time)  Medications Ordered in UC Medications - No data to display   Initial Impression / Assessment and Plan / UC Course  I have reviewed the triage vital signs and the nursing notes.  Pertinent labs & imaging results that were available during my care of the patient were reviewed by me and considered in my medical decision making (see chart for details).  Clinical Course     Symptomatic treatment with fluids and Zofran. Work note provided. Discussed expected time course. Follow-up as needed.  Final Clinical Impressions(s) / UC Diagnoses   Final diagnoses:  Gastroenteritis    New Prescriptions Discharge Medication List as of 08/14/2016  6:36 PM    START taking these medications   Details  ondansetron (ZOFRAN) 4 MG tablet Take 1 tablet (4 mg  total) by mouth every 8 (eight) hours as needed for nausea or vomiting., Starting Sat 08/14/2016, Normal         Charm RingsErin J Yuna Pizzolato, MD 08/14/16 1900

## 2016-08-14 NOTE — ED Triage Notes (Signed)
Patient reports intermittent lower abdominal pain with n/v/d x 2 days. Was unable to go to work last night. States has been able to keep very little fluids down.

## 2016-08-14 NOTE — Discharge Instructions (Signed)
You have a stomach bug. This should gradually improve over the next several days. It may take a week for the diarrhea to fully resolve. Use the Zofran every 8 hours as needed for nausea. Make sure you are drinking fluids. When you feel ready to eat, eat foods such as bananas, rice, applesauce, or toast. Follow-up as needed.

## 2016-08-24 ENCOUNTER — Encounter (HOSPITAL_COMMUNITY): Payer: Self-pay

## 2016-08-24 ENCOUNTER — Emergency Department (HOSPITAL_COMMUNITY)
Admission: EM | Admit: 2016-08-24 | Discharge: 2016-08-24 | Disposition: A | Discharge status: Home / Self Care | Payer: Medicaid Other | Attending: Emergency Medicine | Admitting: Emergency Medicine

## 2016-08-24 DIAGNOSIS — Z202 Contact with and (suspected) exposure to infections with a predominantly sexual mode of transmission: Secondary | ICD-10-CM | POA: Insufficient documentation

## 2016-08-24 DIAGNOSIS — R369 Urethral discharge, unspecified: Secondary | ICD-10-CM | POA: Diagnosis not present

## 2016-08-24 DIAGNOSIS — J45909 Unspecified asthma, uncomplicated: Secondary | ICD-10-CM | POA: Insufficient documentation

## 2016-08-24 DIAGNOSIS — Z711 Person with feared health complaint in whom no diagnosis is made: Secondary | ICD-10-CM

## 2016-08-24 LAB — RAPID HIV SCREEN (HIV 1/2 AB+AG)
HIV 1/2 ANTIBODIES: NONREACTIVE
HIV-1 P24 ANTIGEN - HIV24: NONREACTIVE

## 2016-08-24 MED ORDER — LIDOCAINE HCL (PF) 1 % IJ SOLN
0.9000 mL | Freq: Once | INTRAMUSCULAR | Status: AC
  Administered 2016-08-24: 0.9 mL
  Filled 2016-08-24: qty 5

## 2016-08-24 MED ORDER — AZITHROMYCIN 250 MG PO TABS
1000.0000 mg | ORAL_TABLET | Freq: Once | ORAL | Status: AC
  Administered 2016-08-24: 1000 mg via ORAL
  Filled 2016-08-24: qty 4

## 2016-08-24 MED ORDER — CEFTRIAXONE SODIUM 250 MG IJ SOLR
250.0000 mg | Freq: Once | INTRAMUSCULAR | Status: AC
  Administered 2016-08-24: 250 mg via INTRAMUSCULAR
  Filled 2016-08-24: qty 250

## 2016-08-24 NOTE — ED Provider Notes (Signed)
[facility] Provider Note   CSN: [id number] Arrival date & time: 08/24/16  1241  By signing my name below, I, Majel HomerPeyton Lee, attest that this documentation has been prepared under the direction and in the presence of Demetrios LollKenneth Leaphart, PA-C . Electronically Signed: Majel HomerPeyton Lee, Scribe. 08/24/2016. 2:44 PM.  History   Chief Complaint Chief Complaint  Patient presents with  . Exposure to STD   The history is provided by the patient. No language interpreter was used.   HPI Comments: Roberto Castro is a 21 y.o. male who presents to the Emergency Department requesting a STD check. Pt reports he noticed "white, creamy" discharge from his penis ~2 weeks ago and has experienced similar symptoms every day since then. He notes associated dysuria and states his urine now has a "stronger smell than normal." Pt admits to having unprotected intercourse with several partners within the past 6 months; however, he states he has not been told of similar symptoms by any of his partners. He denies fever, n/v/d, abdominal pain.    Past Medical History:  Diagnosis Date  . Asthma   . Seasonal allergies    There are no active problems to display for this patient.  History reviewed. No pertinent surgical history.  Home Medications    Prior to Admission medications   Medication Sig Start Date End Date Taking? Authorizing Provider  acetaminophen (TYLENOL) 500 MG tablet Take 1,000 mg by mouth every 6 (six) hours as needed for mild pain.    Historical Provider, MD  ondansetron (ZOFRAN) 4 MG tablet Take 1 tablet (4 mg total) by mouth every 8 (eight) hours as needed for nausea or vomiting. 08/14/16   Charm RingsErin J Honig, MD   Family History No family history on file.  Social History Social History  Substance Use Topics  . Smoking status: Never Smoker  . Smokeless tobacco: Never Used  . Alcohol use Yes     Comment: occ     Allergies   Shellfish allergy; Benadryl [diphenhydramine]; and Ibuprofen   Review  of Systems Review of Systems  Constitutional: Negative for fever.  Gastrointestinal: Negative for abdominal pain.  Genitourinary: Positive for discharge and dysuria.  All other systems reviewed and are negative.  Physical Exam Updated Vital Signs BP 128/91 (BP Location: Right Arm)   Pulse 63   Temp 98.3 F (36.8 C) (Oral)   Resp 16   SpO2 98%   Physical Exam  Constitutional: He is oriented to person, place, and time. He appears well-developed and well-nourished.  HENT:  Head: Normocephalic.  Eyes: EOM are normal.  Neck: Normal range of motion. Neck supple.  Pulmonary/Chest: Effort normal.  Abdominal: Soft. He exhibits no distension. There is no tenderness. There is no rebound and no guarding.  Genitourinary: Testes normal and penis normal. Right testis shows no mass, no swelling and no tenderness. Left testis shows no mass, no swelling and no tenderness. Uncircumcised. No penile erythema or penile tenderness. No discharge found.  Genitourinary Comments: Chaperone was present for exam which was performed with no discomfort or complications.   Musculoskeletal: Normal range of motion.  Lymphadenopathy:    He has no cervical adenopathy. No inguinal adenopathy noted on the right or left side.  Neurological: He is alert and oriented to person, place, and time.  Skin: Skin is warm and dry. Capillary refill takes less than 2 seconds.  Psychiatric: He has a normal mood and affect.  Nursing note and vitals reviewed.  ED Treatments / Results  Labs (  all labs ordered are listed, but only abnormal results are displayed) Labs Reviewed  RAPID HIV SCREEN (HIV 1/2 AB+AG)  RPR  GC/CHLAMYDIA PROBE AMP ([place]) NOT AT [facility]    EKG  EKG Interpretation None       Radiology No results found.  Procedures Procedures (including critical care time)  Medications Ordered in ED Medications  azithromycin (ZITHROMAX) tablet 1,000 mg (not administered)  cefTRIAXone (ROCEPHIN) injection  250 mg (not administered)  lidocaine (PF) (XYLOCAINE) 1 % injection 0.9 mL (not administered)   DIAGNOSTIC STUDIES:  Oxygen Saturation is 98% on RA, normal by my interpretation.    COORDINATION OF CARE:  2:42 PM Discussed treatment plan with pt at bedside and pt agreed to plan.  Initial Impression / Assessment and Plan / ED Course  I have reviewed the triage vital signs and the nursing notes.  Pertinent labs & imaging results that were available during my care of the patient were reviewed by me and considered in my medical decision making (see chart for details).  Clinical Course   Patient treated in the ED for STI with Rocephin and azithromycin. Patient advised to inform and treat all sexual partners.  Pt advised on safe sex practices and understands that they have GC/Chlamydia cultures pending and will result in 2-3 days. HIV and RPR sent. Pt encouraged to follow up at local health department for future STI checks. No concern for prostatitis or epididymitis. Discussed return precautions. Pt appears safe for discharge.    I personally performed the services described in this documentation, which was scribed in my presence. The recorded information has been reviewed and is accurate.   Final Clinical Impressions(s) / ED Diagnoses   Final diagnoses:  Concern about STD in male without diagnosis    New Prescriptions New Prescriptions   No medications on file     Rise Mu, PA-C 08/24/16 1513    Gerhard Munch, MD 08/24/16 2037

## 2016-08-24 NOTE — ED Triage Notes (Signed)
Pt requesting to be tested for STDs due to penile discharge and dysuria

## 2016-08-24 NOTE — Discharge Instructions (Signed)
You have been treated for an STD in the ED today. You have gonorrhea and chlamydia cultures pending. He will be notified of the results. You need to inform all sexual partners that he was treated for an STD. Please abstain from sexual intercourse for 10-14 days. Remember to use protection. He may follow up with the health department for further testing as necessary. Return to the ED if he develops any worsening symptoms including abdominal pain, fever, testicular swelling or pain.

## 2016-08-25 LAB — RPR: RPR Ser Ql: NONREACTIVE

## 2016-08-25 LAB — GC/CHLAMYDIA PROBE AMP (~~LOC~~) NOT AT ARMC
Chlamydia: NEGATIVE
Neisseria Gonorrhea: NEGATIVE

## 2017-11-22 ENCOUNTER — Emergency Department (HOSPITAL_COMMUNITY)
Admission: EM | Admit: 2017-11-22 | Discharge: 2017-11-22 | Disposition: A | Discharge status: Home / Self Care | Payer: Self-pay | Attending: Emergency Medicine | Admitting: Emergency Medicine

## 2017-11-22 ENCOUNTER — Encounter (HOSPITAL_COMMUNITY): Payer: Self-pay

## 2017-11-22 DIAGNOSIS — Z5321 Procedure and treatment not carried out due to patient leaving prior to being seen by health care provider: Secondary | ICD-10-CM | POA: Insufficient documentation

## 2017-11-22 DIAGNOSIS — M79631 Pain in right forearm: Secondary | ICD-10-CM | POA: Insufficient documentation

## 2017-11-22 NOTE — ED Notes (Signed)
Called in waiting room with no answer 

## 2017-11-22 NOTE — ED Notes (Signed)
Called for pt from lobby with no answer.
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
## 2017-11-22 NOTE — ED Notes (Signed)
Called again with no answer.

## 2017-11-26 ENCOUNTER — Emergency Department (HOSPITAL_COMMUNITY)
Admission: EM | Admit: 2017-11-26 | Discharge: 2017-11-26 | Disposition: A | Discharge status: Home / Self Care | Payer: Self-pay | Attending: Emergency Medicine | Admitting: Emergency Medicine

## 2017-11-26 ENCOUNTER — Encounter (HOSPITAL_COMMUNITY): Payer: Self-pay | Admitting: Emergency Medicine

## 2017-11-26 ENCOUNTER — Other Ambulatory Visit: Payer: Self-pay

## 2017-11-26 ENCOUNTER — Emergency Department (HOSPITAL_COMMUNITY): Payer: Self-pay

## 2017-11-26 DIAGNOSIS — R0981 Nasal congestion: Secondary | ICD-10-CM | POA: Insufficient documentation

## 2017-11-26 DIAGNOSIS — J45909 Unspecified asthma, uncomplicated: Secondary | ICD-10-CM | POA: Insufficient documentation

## 2017-11-26 DIAGNOSIS — N50812 Left testicular pain: Secondary | ICD-10-CM | POA: Insufficient documentation

## 2017-11-26 DIAGNOSIS — R030 Elevated blood-pressure reading, without diagnosis of hypertension: Secondary | ICD-10-CM | POA: Insufficient documentation

## 2017-11-26 DIAGNOSIS — Z202 Contact with and (suspected) exposure to infections with a predominantly sexual mode of transmission: Secondary | ICD-10-CM | POA: Insufficient documentation

## 2017-11-26 MED ORDER — LIDOCAINE HCL 1 % IJ SOLN
INTRAMUSCULAR | Status: AC
  Administered 2017-11-26: 2.1 mL
  Filled 2017-11-26: qty 20

## 2017-11-26 MED ORDER — CEFTRIAXONE SODIUM 250 MG IJ SOLR
250.0000 mg | Freq: Once | INTRAMUSCULAR | Status: AC
  Administered 2017-11-26: 250 mg via INTRAMUSCULAR
  Filled 2017-11-26: qty 250

## 2017-11-26 MED ORDER — AZITHROMYCIN 250 MG PO TABS
1000.0000 mg | ORAL_TABLET | Freq: Once | ORAL | Status: AC
  Administered 2017-11-26: 1000 mg via ORAL
  Filled 2017-11-26: qty 4

## 2017-11-26 NOTE — ED Triage Notes (Signed)
Patient wants to be checked for stds and Patient is complaining of left testicle pain. Patient states the pain started on Tuesday. Patient is also complaining of congestion.

## 2017-11-26 NOTE — ED Provider Notes (Addendum)
[facility]-EMERGENCY DEPT Provider Note   CSN: [id number] Arrival date & time: 11/26/17  4098     History   Chief Complaint Chief Complaint  Patient presents with  . Testicle Pain  . Exposure to STD  . Nasal Congestion    HPI Roberto Castro is a 22 y.o. male.  Complains of left testicular pain onset approximately 2 weeks ago, comes and goes nothing makes symptoms better or worse.  Pain is intermittent last 20 minutes at a time.  No pain at present denies urination.  He wants to be checked for sexually transmitted diseases saying he has had sex with 2 separate females.  Denies fever denies discharge from penis denies dysuria or burning on urination.  Denies abdominal pain.  He also reports that he is congested in his nose for the past several days stating "it'sseasonal allergies" .  No treatment prior to coming here nothing makes symptoms better or worse there is no other associated symptoms HPI  Past Medical History:  Diagnosis Date  . Asthma   . Seasonal allergies     There are no active problems to display for this patient.   History reviewed. No pertinent surgical history.      Home Medications    Prior to Admission medications   Medication Sig Start Date End Date Taking? Authorizing Provider  acetaminophen (TYLENOL) 500 MG tablet Take 1,000 mg by mouth every 6 (six) hours as needed for mild pain.    [provider]  ondansetron (ZOFRAN) 4 MG tablet Take 1 tablet (4 mg total) by mouth every 8 (eight) hours as needed for nausea or vomiting. 08/14/16   Charm Rings, MD    Family History History reviewed. No pertinent family history.  Social History Social History   Tobacco Use  . Smoking status: Never Smoker  . Smokeless tobacco: Never Used  Substance Use Topics  . Alcohol use: Yes    Comment: occ  . Drug use: Yes    Types: Marijuana    Comment: sometimes   Cigar smoker   Allergies   Shellfish allergy; Benadryl  [diphenhydramine]; and Ibuprofen   Review of Systems Review of Systems  Constitutional: Negative.   HENT: Positive for congestion.   Respiratory: Negative.   Cardiovascular: Negative.   Gastrointestinal: Negative.   Genitourinary: Positive for testicular pain.  Musculoskeletal: Negative.   Skin: Negative.   Neurological: Negative.   Psychiatric/Behavioral: Negative.   All other systems reviewed and are negative.    Physical Exam Updated Vital Signs BP (!) 133/93 (BP Location: Right Arm)   Pulse 78   Temp 98.5 F (36.9 C) (Oral)   Resp 18   Ht 6\' 3"  (1.905 m)   Wt 90.7 kg (200 lb)   SpO2 99%   BMI 25.00 kg/m   Physical Exam  Constitutional: He appears well-developed and well-nourished.  HENT:  Head: Normocephalic and atraumatic.  Eyes: Pupils are equal, round, and reactive to light. Conjunctivae are normal.  Neck: Neck supple. No tracheal deviation present. No thyromegaly present.  Cardiovascular: Normal rate and regular rhythm.  No murmur heard. Pulmonary/Chest: Effort normal and breath sounds normal.  Abdominal: Soft. Bowel sounds are normal. He exhibits no distension. There is no tenderness.  Genitourinary: Penis normal.  Genitourinary Comments: Scrotum normal  Musculoskeletal: Normal range of motion. He exhibits no edema or tenderness.  Neurological: He is alert. Coordination normal.  Skin: Skin is warm and dry. No rash noted.  Psychiatric: He has a normal  mood and affect.  Nursing note and vitals reviewed.    ED Treatments / Results  Labs (all labs ordered are listed, but only abnormal results are displayed) Labs Reviewed - No data to display Results for orders placed or performed during the hospital encounter of 08/24/16  Rapid HIV screen (HIV 1/2 Ab+Ag)  Result Value Ref Range   HIV-1 P24 Antigen - HIV24 NON REACTIVE NON REACTIVE   HIV 1/2 Antibodies NON REACTIVE NON REACTIVE   Interpretation (HIV Ag Ab)      A non reactive test result means that  HIV 1 or HIV 2 antibodies and HIV 1 p24 antigen were not detected in the specimen.  RPR  Result Value Ref Range   RPR Ser Ql Non Reactive Non Reactive  GC/Chlamydia probe amp ([place])not at [facility]  Result Value Ref Range   Chlamydia Negative    Neisseria gonorrhea Negative    [place] Scrotum W/doppler  Result Date: 11/26/2017 CLINICAL DATA:  22 year old male with left testicular pain for 4 days. EXAM: SCROTAL ULTRASOUND DOPPLER ULTRASOUND OF THE TESTICLES TECHNIQUE: Complete ultrasound examination of the testicles, epididymis, and other scrotal structures was performed. Color and spectral Doppler ultrasound were also utilized to evaluate blood flow to the testicles. COMPARISON:  CT Abdomen and Pelvis 09/24/2015. FINDINGS: Right testicle Measurements: 4.7 x 2.0 x 3.0 centimeters. No mass or microlithiasis visualized. Left testicle Measurements: 4.6 x 1.8 x 2.9 centimeters. No mass or microlithiasis visualized. Right epididymis:  Normal in size and appearance. Left epididymis: Within normal limits. No hypervascularity demonstrated. Hydrocele:  None visualized. Varicocele:  None visualized. Pulsed Doppler interrogation of both testes demonstrates normal low resistance arterial and venous waveforms bilaterally. IMPRESSION: Normal scrotal ultrasound with Doppler. Negative for testicular torsion. Electronically Signed   By: Odessa FlemingH  Hall M.D.   On: 11/26/2017 10:55   EKG None  Radiology No results found.  Procedures Procedures (including critical care time)  Medications Ordered in ED Medications - No data to display   Initial Impression / Assessment and Plan / ED Course  I have reviewed the triage vital signs and the nursing notes.  Pertinent labs & imaging results that were available during my care of the patient were reviewed by me and considered in my medical decision making (see chart for details).     11 AM patient asymptomatic.  No testicular pain.  He denies that he was definitely exposed  to an STD.  He states he had 2 recent new sexual partners.  Shared medical decision making.  He prefers to be treated empirically with antibiotics.  He will receive Rocephin im and oral azithromycin prior to discharge.  STD panel pending. Suggest blood pressure recheck 3 weeks.  Referral primary  care.  Referral [facility] Safe sex encouraged. no further treatment needed for nasal congestion Final Clinical Impressions(s) / ED Diagnoses  Diagnoses #1 left testicular pain #2 elevated blood pressure Final diagnoses:  None   #3 nasal congestion ED Discharge Orders    None       Doug SouJacubowitz, Patryce Depriest, MD 11/26/17 1108    Doug SouJacubowitz, Monaca Wadas, MD 11/26/17 1133

## 2017-11-26 NOTE — ED Notes (Signed)
US in process

## 2017-11-26 NOTE — Discharge Instructions (Addendum)
Tylenol or Advil as needed for pain.. Use a condom each time that you have sex.  Call [facility] if you have continued pain in your testicles.  Call the number on these instructions to get a primary care physician.  Your blood pressure should be rechecked within the next 3 weeks.  Today's was elevated at 146/96

## 2017-11-27 LAB — RPR: RPR: NONREACTIVE

## 2017-11-27 LAB — HIV ANTIBODY (ROUTINE TESTING W REFLEX): HIV Screen 4th Generation wRfx: NONREACTIVE

## 2017-11-28 LAB — GC/CHLAMYDIA PROBE AMP (~~LOC~~) NOT AT ARMC
CHLAMYDIA, DNA PROBE: NEGATIVE
Neisseria Gonorrhea: NEGATIVE

## 2019-03-13 ENCOUNTER — Emergency Department (HOSPITAL_COMMUNITY)
Admission: EM | Admit: 2019-03-13 | Discharge: 2019-03-13 | Disposition: A | Discharge status: Home / Self Care | Payer: HRSA Program | Attending: Emergency Medicine | Admitting: Emergency Medicine

## 2019-03-13 ENCOUNTER — Other Ambulatory Visit: Payer: Self-pay

## 2019-03-13 ENCOUNTER — Emergency Department (HOSPITAL_COMMUNITY): Payer: HRSA Program

## 2019-03-13 ENCOUNTER — Encounter (HOSPITAL_COMMUNITY): Payer: Self-pay

## 2019-03-13 DIAGNOSIS — U071 COVID-19: Secondary | ICD-10-CM | POA: Insufficient documentation

## 2019-03-13 DIAGNOSIS — Z113 Encounter for screening for infections with a predominantly sexual mode of transmission: Secondary | ICD-10-CM | POA: Diagnosis not present

## 2019-03-13 DIAGNOSIS — Z20822 Contact with and (suspected) exposure to covid-19: Secondary | ICD-10-CM

## 2019-03-13 DIAGNOSIS — J45909 Unspecified asthma, uncomplicated: Secondary | ICD-10-CM | POA: Diagnosis not present

## 2019-03-13 DIAGNOSIS — Z202 Contact with and (suspected) exposure to infections with a predominantly sexual mode of transmission: Secondary | ICD-10-CM | POA: Diagnosis not present

## 2019-03-13 DIAGNOSIS — R05 Cough: Secondary | ICD-10-CM | POA: Diagnosis present

## 2019-03-13 DIAGNOSIS — Z20828 Contact with and (suspected) exposure to other viral communicable diseases: Secondary | ICD-10-CM

## 2019-03-13 LAB — URINALYSIS, ROUTINE W REFLEX MICROSCOPIC
Bilirubin Urine: NEGATIVE
Glucose, UA: NEGATIVE mg/dL
Hgb urine dipstick: NEGATIVE
Ketones, ur: NEGATIVE mg/dL
Leukocytes,Ua: NEGATIVE
Nitrite: NEGATIVE
Protein, ur: NEGATIVE mg/dL
Specific Gravity, Urine: 1.032 — ABNORMAL HIGH (ref 1.005–1.030)
pH: 6 (ref 5.0–8.0)

## 2019-03-13 MED ORDER — AZITHROMYCIN 250 MG PO TABS
1000.0000 mg | ORAL_TABLET | Freq: Once | ORAL | Status: AC
  Administered 2019-03-13: 12:00:00 1000 mg via ORAL
  Filled 2019-03-13: qty 4

## 2019-03-13 MED ORDER — STERILE WATER FOR INJECTION IJ SOLN
INTRAMUSCULAR | Status: AC
  Administered 2019-03-13: 13:00:00 10 mL
  Filled 2019-03-13: qty 10

## 2019-03-13 MED ORDER — ACETAMINOPHEN 500 MG PO TABS
1000.0000 mg | ORAL_TABLET | Freq: Once | ORAL | Status: AC
  Administered 2019-03-13: 12:00:00 1000 mg via ORAL
  Filled 2019-03-13: qty 2

## 2019-03-13 MED ORDER — CEFTRIAXONE SODIUM 250 MG IJ SOLR
250.0000 mg | Freq: Once | INTRAMUSCULAR | Status: AC
  Administered 2019-03-13: 250 mg via INTRAMUSCULAR
  Filled 2019-03-13: qty 250

## 2019-03-13 NOTE — ED Notes (Signed)
An After Visit Summary was printed and given to the patient. Discharge instructions given and no further questions at this time.  

## 2019-03-13 NOTE — ED Triage Notes (Addendum)
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   Patient c/o headache, nausea, and spitting up mucous that started about 2 days ago. Patient states he woke up with sweats the other night.   Denies abdominal pain, emesis or diarrhea.  Last BM last night and normal for patient.   Patient wants to be tested for COVID today and STD check.   Patient states one of sexual aprtners stated she felt "funny down there".    A/ox4 Ambulatory in triage.

## 2019-03-13 NOTE — ED Provider Notes (Signed)
[facility]-EMERGENCY DEPT Provider Note   CSN: [id number] Arrival date & time: 03/13/19  1028    History   Chief Complaint Chief Complaint  Patient presents with  . COVID Test  . STD check    HPI Johnn HaiJeremy A Caruthers is a 23 y.o. male.     The history is provided by the patient and medical records. No language interpreter was used.   Johnn HaiJeremy A Muff is a 23 y.o. male  with a PMH of asthma, allergies who presents to the Emergency Department with two complaints:  1.  STD check.  Patient states he is currently asymptomatic, but a sexual partner of his told him that she "feels funny down there" denies any abdominal pain, penile discharge/pain, GU lesions, scrotal pain or swelling.  2.  Cough, headache and low-grade temperature x 2 days.  Denies fever of her getting over 100, multiple checks in the 99 range.  No medications taken prior to arrival for symptoms.  He did have a coworker that tested positive for corona virus 2 weeks ago and he did work with him just prior to known diagnosis.  Denies any chest pain or trouble breathing.  No neck pain.  No visual changes.  No nausea or vomiting.  Past Medical History:  Diagnosis Date  . Asthma   . Seasonal allergies     There are no active problems to display for this patient.   History reviewed. No pertinent surgical history.      Home Medications    Prior to Admission medications   Medication Sig Start Date End Date Taking? Authorizing Provider  ondansetron (ZOFRAN) 4 MG tablet Take 1 tablet (4 mg total) by mouth every 8 (eight) hours as needed for nausea or vomiting. Patient not taking: Reported on 11/26/2017 08/14/16   Charm RingsHonig, Erin J, MD    Family History History reviewed. No pertinent family history.  Social History Social History   Tobacco Use  . Smoking status: Never Smoker  . Smokeless tobacco: Never Used  Substance Use Topics  . Alcohol use: Yes    Comment: occ  . Drug use: Yes    Types:  Marijuana    Comment: sometimes      Allergies   Shellfish allergy, Benadryl [diphenhydramine], and Ibuprofen   Review of Systems Review of Systems  Constitutional: Positive for fever (Low grade).  HENT: Negative for congestion and sore throat.   Respiratory: Positive for cough. Negative for shortness of breath.   Genitourinary: Negative for difficulty urinating, dysuria, frequency, genital sores, penile pain, penile swelling, scrotal swelling and testicular pain.  All other systems reviewed and are negative.    Physical Exam Updated Vital Signs BP (!) 143/99 (BP Location: Right Arm)   Pulse 98   Temp 99.5 F (37.5 C) (Oral)   Resp 18   SpO2 98%   Physical Exam Vitals signs and nursing note reviewed.  Constitutional:      General: He is not in acute distress.    Appearance: He is well-developed.     Comments: Nontoxic-appearing.  HENT:     Head: Normocephalic and atraumatic.     Mouth/Throat:     Mouth: Mucous membranes are moist.     Pharynx: Oropharynx is clear.  Neck:     Musculoskeletal: Neck supple.     Comments: No meningeal signs. Cardiovascular:     Rate and Rhythm: Normal rate and regular rhythm.     Heart sounds: Normal heart sounds. No murmur.  Pulmonary:  Effort: Pulmonary effort is normal. No respiratory distress.     Breath sounds: Normal breath sounds.     Comments: Lungs clear to auscultation bilaterally. Abdominal:     General: There is no distension.     Palpations: Abdomen is soft.     Tenderness: There is no abdominal tenderness.     Comments: No abdominal tenderness.  Genitourinary:    Comments: No discharge from penis. No signs of lesion or erythema on the penis or testicles. The penis and testicles are nontender. No testicular masses or swelling. Skin:    General: Skin is warm and dry.  Neurological:     Mental Status: He is alert and oriented to person, place, and time.     Comments: Speech clear and goal oriented. CN 2-12 grossly  intact. Strength and sensation intact. Steady gait.       ED Treatments / Results  Labs (all labs ordered are listed, but only abnormal results are displayed) Labs Reviewed  URINALYSIS, ROUTINE W REFLEX MICROSCOPIC - Abnormal; Notable for the following components:      Result Value   Specific Gravity, Urine 1.032 (*)    All other components within normal limits  NOVEL CORONAVIRUS, NAA (HOSPITAL ORDER, SEND-OUT TO REF LAB)  RPR  HIV ANTIBODY (ROUTINE TESTING W REFLEX)  GC/CHLAMYDIA PROBE AMP ([place]) NOT AT [facility]    EKG None  Radiology Dg Chest Portable 1 View  Result Date: 03/13/2019 CLINICAL DATA:  Headaches with nausea and spitting up mucus. Recent COVID-19 exposure. EXAM: PORTABLE CHEST 1 VIEW COMPARISON:  Radiographs 11/21/2012 and 03/07/2009. FINDINGS: 1144 hours. The heart size and mediastinal contours are normal. The lungs are clear. There is no pleural effusion or pneumothorax. No acute osseous findings are identified. IMPRESSION: Stable chest.  No active cardiopulmonary process. Electronically Signed   By: Richardean Sale M.D.   On: 03/13/2019 13:53    Procedures Procedures (including critical care time)  Medications Ordered in ED Medications  cefTRIAXone (ROCEPHIN) injection 250 mg (250 mg Intramuscular Given 03/13/19 1229)  azithromycin (ZITHROMAX) tablet 1,000 mg (1,000 mg Oral Given 03/13/19 1229)  acetaminophen (TYLENOL) tablet 1,000 mg (1,000 mg Oral Given 03/13/19 1229)  sterile water (preservative free) injection (10 mLs  Given 03/13/19 1230)     Initial Impression / Assessment and Plan / ED Course  I have reviewed the triage vital signs and the nursing notes.  Pertinent labs & imaging results that were available during my care of the patient were reviewed by me and considered in my medical decision making (see chart for details).       BRENTON JOINES is a 23 y.o. male who presents to ED for two complaints:  1. STD check. Normal GU exam. No symptoms.  Had a sexual contact tell him she possibly had STI's and was getting tested. He would like treatment today.  Gonorrhea, chlamydia, HIV and RPR sent.  Urinalysis without trichomoniasis or signs of infection. Health department follow up if needed.  2. Cough, headache x 2 days.  Afebrile, hemodynamically stable with clear lung exam, benign abdominal exam.  No nuchal rigidity or concern for meningitis.  Chest x-ray without signs of pneumonia.  Outpatient COVID tests ordered as he did have contact at work who tested positive for coronavirus 2 weeks ago.  Reasons to return to the emergency department were discussed and all questions were answered.  Final Clinical Impressions(s) / ED Diagnoses   Final diagnoses:  Exposure to Covid-19 Virus  Possible exposure to  STD    ED Discharge Orders    None       , Chase PicketJaime Pilcher, PA-C 03/13/19 1406    Azalia Bilisampos, Kevin, MD 03/14/19 [phone]

## 2019-03-13 NOTE — Discharge Instructions (Signed)
It was my pleasure taking care of you today!   Your chest x-ray was normal today.   You were tested for Coronavirus. You will be notified if your results are positive. This is usually in about 2 days.  Please self-quarantine until you receive results. You should quarantine longer if you test positive.   Return to ER for new or worsening symptoms, any additional concerns.

## 2019-03-14 LAB — HIV ANTIBODY (ROUTINE TESTING W REFLEX): HIV Screen 4th Generation wRfx: NONREACTIVE

## 2019-03-14 LAB — RPR: RPR Ser Ql: NONREACTIVE

## 2019-03-14 LAB — NOVEL CORONAVIRUS, NAA (HOSP ORDER, SEND-OUT TO REF LAB; TAT 18-24 HRS): SARS-CoV-2, NAA: DETECTED — AB

## 2019-04-20 ENCOUNTER — Other Ambulatory Visit: Payer: Self-pay

## 2019-04-20 ENCOUNTER — Emergency Department (HOSPITAL_COMMUNITY)
Admission: EM | Admit: 2019-04-20 | Discharge: 2019-04-20 | Disposition: A | Discharge status: Home / Self Care | Payer: Self-pay | Attending: Emergency Medicine | Admitting: Emergency Medicine

## 2019-04-20 ENCOUNTER — Encounter (HOSPITAL_COMMUNITY): Payer: Self-pay

## 2019-04-20 ENCOUNTER — Emergency Department (HOSPITAL_COMMUNITY): Payer: Self-pay

## 2019-04-20 DIAGNOSIS — Z23 Encounter for immunization: Secondary | ICD-10-CM | POA: Insufficient documentation

## 2019-04-20 DIAGNOSIS — W010XXA Fall on same level from slipping, tripping and stumbling without subsequent striking against object, initial encounter: Secondary | ICD-10-CM | POA: Insufficient documentation

## 2019-04-20 DIAGNOSIS — S53125A Posterior dislocation of left ulnohumeral joint, initial encounter: Secondary | ICD-10-CM | POA: Insufficient documentation

## 2019-04-20 DIAGNOSIS — S0181XA Laceration without foreign body of other part of head, initial encounter: Secondary | ICD-10-CM | POA: Insufficient documentation

## 2019-04-20 DIAGNOSIS — J45909 Unspecified asthma, uncomplicated: Secondary | ICD-10-CM | POA: Insufficient documentation

## 2019-04-20 DIAGNOSIS — Y999 Unspecified external cause status: Secondary | ICD-10-CM | POA: Insufficient documentation

## 2019-04-20 DIAGNOSIS — Y929 Unspecified place or not applicable: Secondary | ICD-10-CM | POA: Insufficient documentation

## 2019-04-20 DIAGNOSIS — Y9302 Activity, running: Secondary | ICD-10-CM | POA: Insufficient documentation

## 2019-04-20 MED ORDER — KETAMINE HCL 10 MG/ML IJ SOLN
INTRAMUSCULAR | Status: AC
  Filled 2019-04-20: qty 1

## 2019-04-20 MED ORDER — TETANUS-DIPHTH-ACELL PERTUSSIS 5-2.5-18.5 LF-MCG/0.5 IM SUSP
0.5000 mL | Freq: Once | INTRAMUSCULAR | Status: AC
  Administered 2019-04-20: 0.5 mL via INTRAMUSCULAR
  Filled 2019-04-20: qty 0.5

## 2019-04-20 MED ORDER — KETAMINE HCL 50 MG/5ML IJ SOSY: 1.0000 mg/kg | PREFILLED_SYRINGE | Freq: Once | INTRAMUSCULAR | Status: DC

## 2019-04-20 MED ORDER — LORAZEPAM 2 MG/ML IJ SOLN: 1.0000 mg | Freq: Once | INTRAMUSCULAR | Status: DC

## 2019-04-20 MED ORDER — ACETAMINOPHEN 325 MG PO TABS
650.0000 mg | ORAL_TABLET | Freq: Once | ORAL | Status: AC
  Administered 2019-04-20: 650 mg via ORAL
  Filled 2019-04-20: qty 2

## 2019-04-20 MED ORDER — LORAZEPAM 2 MG/ML IJ SOLN
INTRAMUSCULAR | Status: AC
  Filled 2019-04-20: qty 1

## 2019-04-20 MED ORDER — KETAMINE HCL 10 MG/ML IJ SOLN
INTRAMUSCULAR | Status: AC | PRN
  Administered 2019-04-20: 199.6 mg via INTRAVENOUS

## 2019-04-20 MED ORDER — ONDANSETRON HCL 4 MG/2ML IJ SOLN
4.0000 mg | Freq: Once | INTRAMUSCULAR | Status: AC
  Administered 2019-04-20: 10:00:00 4 mg via INTRAVENOUS
  Filled 2019-04-20: qty 2

## 2019-04-20 MED ORDER — LIDOCAINE HCL (PF) 1 % IJ SOLN
30.0000 mL | Freq: Once | INTRAMUSCULAR | Status: DC
  Filled 2019-04-20: qty 30

## 2019-04-20 MED ORDER — MORPHINE SULFATE (PF) 4 MG/ML IV SOLN
4.0000 mg | Freq: Once | INTRAVENOUS | Status: AC
  Administered 2019-04-20: 10:00:00 4 mg via INTRAVENOUS
  Filled 2019-04-20: qty 1

## 2019-04-20 MED ORDER — LIDOCAINE-EPINEPHRINE-TETRACAINE (LET) SOLUTION
3.0000 mL | Freq: Once | NASAL | Status: AC
  Administered 2019-04-20: 12:00:00 3 mL via TOPICAL
  Filled 2019-04-20: qty 3

## 2019-04-20 MED ORDER — SODIUM CHLORIDE 0.9 % IV SOLN
INTRAVENOUS | Status: AC | PRN
  Administered 2019-04-20: 1000 mL via INTRAVENOUS

## 2019-04-20 NOTE — Progress Notes (Signed)
Orthopedic Tech Progress Note Patient Details:  Roberto Castro [birth year] [id number] Done by Roberto Castro. Ortho Devices Type of Ortho Device: Ace wrap, Arm sling, Long arm splint, Post splint Splint Material: Fiberglass Ortho Device/Splint Location: Lt arm Ortho Device/Splint Interventions: Application   Post Interventions Patient Tolerated: Well Instructions Provided: Adjustment of device, Care of device   Roberto Castro [facility] 04/20/2019, 11:36 AM

## 2019-04-20 NOTE — ED Notes (Signed)
Patient ambulated to the restroom and back without assistance. Patient reports feeling "a little nauseated" but did not throw up. Patient states he feels ready to go home.

## 2019-04-20 NOTE — ED Provider Notes (Signed)
[facility]-EMERGENCY DEPT Provider Note   CSN: [id number] Arrival date & time: 04/20/19  0920     History   Chief Complaint Chief Complaint  Patient presents with  . Fall  . Elbow Injury    HPI Roberto Castro is a 23 y.o. male with past medical history of asthma and seasonal allergies presents emergency department today with chief complaint of fall and left elbow pain.  Patient states 1 hour prior to arrival he was jogging when he hit a pothole causing him to fall and land on the left elbow.  He reports immediate pain, denies any his head or LOC.  He describes the pain as constant and throbbing.  He rates it 10 of 10 in severity.  He did not take anything for pain prior to arrival. Pt reports he is ambidextrous. He denies previous injury to elbow.  He denies fever, chills, numbness, tingling, weakness, headache, neck pain. History provided by patient with additional history obtained from chart review.       Past Medical History:  Diagnosis Date  . Asthma   . Seasonal allergies     There are no active problems to display for this patient.   History reviewed. No pertinent surgical history.      Home Medications    Prior to Admission medications   Medication Sig Start Date End Date Taking? Authorizing Provider  ondansetron (ZOFRAN) 4 MG tablet Take 1 tablet (4 mg total) by mouth every 8 (eight) hours as needed for nausea or vomiting. Patient not taking: Reported on 11/26/2017 08/14/16   Charm RingsHonig, Erin J, MD    Family History Family History  Family history unknown: Yes    Social History Social History   Tobacco Use  . Smoking status: Never Smoker  . Smokeless tobacco: Never Used  Substance Use Topics  . Alcohol use: Yes    Comment: occ  . Drug use: Yes    Types: Marijuana    Comment: sometimes      Allergies   Benadryl [diphenhydramine] and Ibuprofen   Review of Systems Review of Systems  Constitutional: Negative for chills and fever.   HENT: Negative for congestion, rhinorrhea, sinus pressure and sore throat.   Eyes: Negative for pain and redness.  Respiratory: Negative for cough, shortness of breath and wheezing.   Cardiovascular: Negative for chest pain and palpitations.  Gastrointestinal: Negative for abdominal pain, constipation, diarrhea, nausea and vomiting.  Genitourinary: Negative for dysuria.  Musculoskeletal: Positive for arthralgias and joint swelling. Negative for back pain, myalgias and neck pain.  Skin: Positive for wound. Negative for rash.  Neurological: Negative for dizziness, syncope, weakness, numbness and headaches.  Psychiatric/Behavioral: Negative for confusion.     Physical Exam Updated Vital Signs BP (!) 144/86 (BP Location: Right Arm)   Pulse (!) 103   Temp 98.1 F (36.7 C) (Oral)   Resp 18   Ht 6\' 3"  (1.905 m)   Wt 99.8 kg   SpO2 100%   BMI 27.50 kg/m   Physical Exam Vitals signs and nursing note reviewed.  Constitutional:      General: He is not in acute distress.    Appearance: He is not ill-appearing.  HENT:     Head: Normocephalic. No raccoon eyes or Battle's sign.     Comments:  1cm laceration to chin. Bleeding is controlled.  No tenderness to palpation of skull. No deformities or crepitus noted. No open wounds, abrasions or lacerations.    Right Ear: Tympanic membrane  and external ear normal. No hemotympanum.     Left Ear: Tympanic membrane and external ear normal. No hemotympanum.     Nose: Nose normal.     Mouth/Throat:     Mouth: Mucous membranes are moist.     Pharynx: Oropharynx is clear.  Eyes:     General: No scleral icterus.       Right eye: No discharge.        Left eye: No discharge.     Extraocular Movements: Extraocular movements intact.     Conjunctiva/sclera: Conjunctivae normal.     Pupils: Pupils are equal, round, and reactive to light.  Neck:     Musculoskeletal: Normal range of motion.     Vascular: No JVD.     Comments: Full ROM intact without  spinous process TTP. No bony stepoffs or deformities, no paraspinous muscle TTP or muscle spasms. No bruising, erythema, or swelling.  Cardiovascular:     Rate and Rhythm: Regular rhythm. Tachycardia present.     Pulses: Normal pulses.          Radial pulses are 2+ on the right side and 2+ on the left side.     Heart sounds: Normal heart sounds.  Pulmonary:     Comments: Lungs clear to auscultation in all fields. Symmetric chest rise. No wheezing, rales, or rhonchi. Abdominal:     Comments: Abdomen is soft, non-distended, and non-tender in all quadrants. No rigidity, no guarding. No peritoneal signs.  Musculoskeletal: Normal range of motion.     Comments: Obvious deformity to left elbow. No open fracture. Radially pulses 2+ bilaterally. Left hand is warm with cap refill less than <2 seconds. Able to wiggle fingers. Neurovascularly intact distally. Compartments soft above and below affected joint.   Full range of motion of the T-spine and L-spine No tenderness to palpation of the spinous processes of the T-spine or L-spine No crepitus, deformity or step-offs No tenderness to palpation of the paraspinous muscles of the L-spine. Ambulates with steady gait.   Skin:    General: Skin is warm and dry.     Capillary Refill: Capillary refill takes less than 2 seconds.  Neurological:     Mental Status: He is oriented to person, place, and time.     GCS: GCS eye subscore is 4. GCS verbal subscore is 5. GCS motor subscore is 6.     Comments: Speech is clear and goal oriented, follows commands CN III-XII intact, no facial droop Sensation normal to light and sharp touch Normal gait and balance   Psychiatric:        Behavior: Behavior normal.      ED Treatments / Results  Labs (all labs ordered are listed, but only abnormal results are displayed) Labs Reviewed - No data to display  EKG None  Radiology Dg Elbow 2 Views Left  Result Date: 04/20/2019 CLINICAL DATA:  Status post left elbow  reduction. EXAM: LEFT ELBOW - 2 VIEW COMPARISON:  Radiographs of same day. FINDINGS: The left elbow has been splinted and immobilized. There is been successful reduction of the proximal radial and ulnar dislocations noted on prior exam. No definite fracture or dislocation is noted. IMPRESSION: Successful reduction of proximal radioulnar dislocation noted on prior exam. No definite fracture is noted. Electronically Signed   By: Lupita Raider M.D.   On: 04/20/2019 12:04   Dg Elbow Complete Left  Result Date: 04/20/2019 CLINICAL DATA:  Recent fall while jogging with elbow dislocation EXAM: LEFT ELBOW -  COMPLETE 3+ VIEW COMPARISON:  None. FINDINGS: Posterior dislocation of the proximal radius and ulna with respect to the distal humerus is seen. No definitive fracture is noted. Soft tissue swelling is seen. IMPRESSION: Posterior dislocation of the proximal radius and ulna with respect to the distal humerus. No definitive fracture is noted at this time. Electronically Signed   By: Inez Catalina M.D.   On: 04/20/2019 11:03    Procedures Reduction of dislocation  Date/Time: 04/20/2019 1:12 PM Performed by: Cherre Robins, PA-C Authorized by: Cherre Robins, PA-C  Consent: Verbal consent obtained. Written consent obtained. Risks and benefits: risks, benefits and alternatives were discussed Consent given by: patient Patient understanding: patient states understanding of the procedure being performed Patient consent: the patient's understanding of the procedure matches consent given Procedure consent: procedure consent matches procedure scheduled Relevant documents: relevant documents present and verified Test results: test results available and properly labeled Site marked: the operative site was marked Imaging studies: imaging studies available Required items: required blood products, implants, devices, and special equipment available Patient identity confirmed: verbally with patient and arm  band Time out: Immediately prior to procedure a "time out" was called to verify the correct patient, procedure, equipment, support staff and site/side marked as required. Preparation: Patient was prepped and draped in the usual sterile fashion. Local anesthesia used: no  Anesthesia: Local anesthesia used: no  Sedation: Patient sedated: yes (md performed sedation) Vitals: Vital signs were monitored during sedation.  Patient tolerance: patient tolerated the procedure well with no immediate complications Comments: Please see separate note for md documentation of sedation  .Marland KitchenLaceration Repair  Date/Time: 04/20/2019 1:14 PM Performed by: Cherre Robins, PA-C Authorized by: Cherre Robins, PA-C   Consent:    Consent obtained:  Verbal   Consent given by:  Patient   Risks discussed:  Infection, pain and need for additional repair   Alternatives discussed:  No treatment and delayed treatment Anesthesia (see MAR for exact dosages):    Anesthesia method:  Topical application   Topical anesthetic:  LET Laceration details:    Location:  Face   Face location:  Chin   Length (cm):  1 Repair type:    Repair type:  Simple Pre-procedure details:    Preparation:  Patient was prepped and draped in usual sterile fashion Exploration:    Hemostasis achieved with:  LET   Wound exploration: wound explored through full range of motion and entire depth of wound probed and visualized     Contaminated: no   Treatment:    Area cleansed with:  Saline   Amount of cleaning:  Standard   Irrigation solution:  Sterile saline   Irrigation volume:  500   Irrigation method:  Syringe Skin repair:    Repair method:  Sutures   Suture size:  4-0   Suture material:  Fast-absorbing gut   Suture technique:  Simple interrupted   Number of sutures:  1 Approximation:    Approximation:  Close Post-procedure details:    Dressing:  Open (no dressing)   Patient tolerance of procedure:  Tolerated well, no  immediate complications .Ortho Injury Treatment  Date/Time: 04/20/2019 3:52 PM Performed by: Cherre Robins, PA-C Authorized by: Cherre Robins, PA-C   Consent:    Consent obtained:  Verbal   Consent given by:  Patient   Risks discussed:  Fracture, irreducible dislocation, recurrent dislocation, nerve damage and restricted joint movement   Alternatives discussed:  No treatmentInjury location: elbow Location details: left  elbow Injury type: dislocation Dislocation type: posterior Pre-procedure distal perfusion: normal Pre-procedure neurological function: normal Pre-procedure range of motion: normal  Anesthesia: Local anesthesia used: no  Patient sedated: Yes. Refer to sedation procedure documentation for details of sedation. Immobilization: splint and sling Splint type: long arm Supplies used: Ortho-Glass Post-procedure neurovascular assessment: post-procedure neurovascularly intact Post-procedure distal perfusion: normal Post-procedure neurological function: normal Post-procedure range of motion: normal Patient tolerance: patient tolerated the procedure well with no immediate complications    (including critical care time)  Medications Ordered in ED Medications  ketamine 50 mg in normal saline 5 mL (10 mg/mL) syringe (100 mg Intravenous Not Given 04/20/19 1140)  lidocaine (PF) (XYLOCAINE) 1 % injection 30 mL (30 mLs Infiltration Not Given 04/20/19 1422)  ondansetron (ZOFRAN) injection 4 mg (4 mg Intravenous Given 04/20/19 1023)  morphine 4 MG/ML injection 4 mg (4 mg Intravenous Given 04/20/19 1023)  ketamine (KETALAR) injection (199.6 mg Intravenous Given 04/20/19 1125)  lidocaine-EPINEPHrine-tetracaine (LET) solution (3 mLs Topical Given 04/20/19 1159)  0.9 %  sodium chloride infusion ( Intravenous Stopped 04/20/19 1300)  Tdap (BOOSTRIX) injection 0.5 mL (0.5 mLs Intramuscular Given 04/20/19 1354)     Initial Impression / Assessment and Plan / ED Course  I have reviewed the  triage vital signs and the nursing notes.  Pertinent labs & imaging results that were available during my care of the patient were reviewed by me and considered in my medical decision making (see chart for details).  Patient seen and examined.  On arrival he appears uncomfortable.  He is afebrile, slightly tachycardic to 103. He has obvious deformity to left elbow.  He is neurovascularly intact.  Able to wiggle all fingers, brisk cap refill.  He has small laceration to left chin.  No signs of serious head injury on exam.  Neuro exam is normal.  Using pecarn patient does not meet criteria for head CT.  Engaged in shared decision making with patient about imaging and he agrees to not proceed with head CT.  Will consider if he has neuro changes. X-ray of left elbow viewed by me shows posterior dislocation of the proximal radius and ulna.  Conscious sedation performed by ED attending Dr. Otho Najjar.  I was able to successfully reduce left elbow.  Repeat imaging again reviewed by myself shows successful reduction.  Patient placed in splint by [employer].  I evaluated patient after placement and he is neurovascular intact, continues to have brisk cap refill, sensation intact.   I sutured patient's chin laceration with absorbable suture.  He tolerated procedure well.  Please see above documentation.  Tetanus updated at today's ED visit.  Patient successfully passed p.o. challenge and ambulates with steady gait. The patient appears reasonably screened and/or stabilized for discharge and I doubt any other medical condition or other [facility] requiring further screening, evaluation, or treatment in the ED at this time prior to discharge. The patient is safe for discharge with strict return precautions discussed. Recommend ortho follow up.  This is shared ED visit with attending Dr. Ranae Palms.  Please see his documentation of sedation.   This note was prepared using Dragon voice recognition software and may include  unintentional dictation errors due to the inherent limitations of voice recognition software.     Final Clinical Impressions(s) / ED Diagnoses   Final diagnoses:  Posterior dislocation of left elbow, initial encounter  Chin laceration, initial encounter    ED Discharge Orders    None       Albrizze, Caroleen Hamman,  PA-C 04/20/19 1556    Loren RacerYelverton, David, MD 04/21/19 [phone]

## 2019-04-20 NOTE — ED Notes (Signed)
Patient eating graham crackers and water for PO trial. Patient vital signs returning to normal limits. Patient states he feels "weak as shit, but better."

## 2019-04-20 NOTE — ED Notes (Signed)
Pt transported to xray 

## 2019-04-20 NOTE — ED Notes (Addendum)
Graham crackers and water provided for PO challenge. Prior to eating, Patient ambulated to restroom with standby assistance and urinated. Upon returning to the bed, patient became diaphoretic and vomited clear emesis. Patient hypertensive. Cool wash cloths placed to patients forehead and chest, and ice pack placed to patient neck. Patient states he started to feel better with the ice. Patient states "I think I got it out of my system" when offered nausea medicine. Patient reports he will rest in bed for approx 10 minutes before attempting PO trial with water. Patient educated to advance to cracker intake.

## 2019-04-20 NOTE — Discharge Instructions (Addendum)
You were seen today for elbow dislocation. Please read and follow all provided instructions.    1. Medications: alternate naprosyn and tylenol for pain control, usual home medications  2. Treatment: rest, ice, elevate. Wear splint until you follow up with orthopedics -The suture in your chin is absorbable.  It should dissolve within 1 week.  Watch for signs of infection including surrounding redness, pus or fever.  Wash the wound daily with soap and water, pat it dry gently.  3. Follow Up: Please followup with orthopedics. Call the office Tuesday since Monday is a holiday to schedule an appointment to be seen that week. Wear the splint until you see the ortho doctor.  Do not sleep in the sling  Please return to the ER for worsening symptoms or other concerns

## 2019-04-20 NOTE — Sedation Documentation (Signed)
Pt awake, alert, still nauseated but no vomiting. HR returned WNL, BP remains elevated. Pt pale and diaphoretic, but denies pain. LET applied to chin per order.

## 2019-04-20 NOTE — ED Notes (Addendum)
This RN spoke with Margeaux PA and Curatolo MD regarding this RN's concerns regarding patient safety being discharged and patient ALDRETE score and hypertension. Dr. Ronnald Nian stated patient safe to go home if he is able to ambulate, is AxOx4, and is able to keep down fluids. Patient states he will try to ambulate to the restroom without assistance, and he has kept down graham crackers x2 and a glass of water. Patient mother present to drive him home. Vital signs remain elevated, but patient AxOx4.

## 2019-04-20 NOTE — Sedation Documentation (Signed)
Pt became nauseated but never vomited. Pt resting comfortably now.

## 2019-04-20 NOTE — Sedation Documentation (Signed)
Pt left elbow dislocation reduced, splinting done by ortho, pt tolerated well, x-ray ordered.

## 2019-04-20 NOTE — Sedation Documentation (Signed)
Temperature- 98.8

## 2019-04-20 NOTE — ED Notes (Signed)
Patient alert and oriented. States he feels "heavy" but otherwise normal. Patient blood pressure remains too high to discharge. EDPA made aware. Patient rates pain at 4/10 to his left elbow.

## 2019-04-20 NOTE — ED Triage Notes (Signed)
Patient states he was jogging and hit a pothole, fell and landed on his left elbow. Patient denied hitting his head or having LOC.

## 2019-04-20 NOTE — ED Provider Notes (Signed)
Pt passed PO challenge without issue. His blood pressure has remained elevated in the ED; Most recent reading 156/101. Pt has been able to ambulate since his conscious sedation without issue and is going home with his mom who is driving. Feel patient is stable for discharge at this time. Have given a dose of Tylenol prior to discharge. Blood pressure may be secondarily elevated due to pain.     Eustaquio Maize, PA-C 04/20/19 1644    Dorie Rank, MD 04/21/19 [phone]

## 2019-04-20 NOTE — Sedation Documentation (Signed)
Xray at bedside. Pt alert but not oriented at this time. Pt remains sedated on ketamine.

## 2019-04-21 NOTE — ED Provider Notes (Signed)
[facility] DEPT Provider Note   CSN: [id number] Arrival date & time: 04/20/19  0920     History   Chief Complaint Chief Complaint  Patient presents with  . Fall  . Elbow Injury    HPI Roberto Castro is a 23 y.o. male.     HPI  Past Medical History:  Diagnosis Date  . Asthma   . Seasonal allergies     There are no active problems to display for this patient.   History reviewed. No pertinent surgical history.      Home Medications    Prior to Admission medications   Medication Sig Start Date End Date Taking? Authorizing Provider  ondansetron (ZOFRAN) 4 MG tablet Take 1 tablet (4 mg total) by mouth every 8 (eight) hours as needed for nausea or vomiting. Patient not taking: Reported on 11/26/2017 08/14/16   Melony Overly, MD    Family History Family History  Family history unknown: Yes    Social History Social History   Tobacco Use  . Smoking status: Never Smoker  . Smokeless tobacco: Never Used  Substance Use Topics  . Alcohol use: Yes    Comment: occ  . Drug use: Yes    Types: Marijuana    Comment: sometimes      Allergies   Benadryl [diphenhydramine] and Ibuprofen   Review of Systems Review of Systems   Physical Exam Updated Vital Signs BP (!) 156/93 (BP Location: Right Arm)   Pulse 90   Temp 98.8 F (37.1 C) (Oral)   Resp 18   Ht 6\' 3"  (1.905 m)   Wt 99.8 kg   SpO2 99%   BMI 27.50 kg/m   Physical Exam   ED Treatments / Results  Labs (all labs ordered are listed, but only abnormal results are displayed) Labs Reviewed - No data to display  EKG None  Radiology Dg Elbow 2 Views Left  Result Date: 04/20/2019 CLINICAL DATA:  Status post left elbow reduction. EXAM: LEFT ELBOW - 2 VIEW COMPARISON:  Radiographs of same day. FINDINGS: The left elbow has been splinted and immobilized. There is been successful reduction of the proximal radial and ulnar dislocations noted on prior exam. No definite  fracture or dislocation is noted. IMPRESSION: Successful reduction of proximal radioulnar dislocation noted on prior exam. No definite fracture is noted. Electronically Signed   By: Marijo Conception M.D.   On: 04/20/2019 12:04   Dg Elbow Complete Left  Result Date: 04/20/2019 CLINICAL DATA:  Recent fall while jogging with elbow dislocation EXAM: LEFT ELBOW - COMPLETE 3+ VIEW COMPARISON:  None. FINDINGS: Posterior dislocation of the proximal radius and ulna with respect to the distal humerus is seen. No definitive fracture is noted. Soft tissue swelling is seen. IMPRESSION: Posterior dislocation of the proximal radius and ulna with respect to the distal humerus. No definitive fracture is noted at this time. Electronically Signed   By: Inez Catalina M.D.   On: 04/20/2019 11:03    Procedures .Sedation  Date/Time: 04/20/2019 12:25 PM Performed by: Julianne Rice, MD Authorized by: Julianne Rice, MD   Consent:    Consent obtained:  Written   Consent given by:  Patient Universal protocol:    Imaging studies available: yes     Immediately prior to procedure a time out was called: yes   Indications:    Procedure performed:  Dislocation reduction   Procedure necessitating sedation performed by:  Different physician Pre-sedation assessment:    Time  since last food or drink:  >12 hrs   ASA classification: class 1 - normal, healthy patient     Neck mobility: normal     Mallampati score:  I - soft palate, uvula, fauces, pillars visible   Pre-sedation assessments completed and reviewed: airway patency, cardiovascular function, hydration status, mental status and respiratory function     Pre-sedation assessment completed:  04/20/2019 12:20 PM Immediate pre-procedure details:    Reassessment: Patient reassessed immediately prior to procedure     Reviewed: vital signs     Verified: bag valve mask available, emergency equipment available, intubation equipment available, IV patency confirmed and oxygen  available   Procedure details (see MAR for exact dosages):    Preoxygenation:  Nasal cannula   Sedation:  Ketamine   Intra-procedure monitoring:  Blood pressure monitoring, cardiac monitor, continuous pulse oximetry, frequent LOC assessments and frequent vital sign checks   Intra-procedure events: none     Total Provider sedation time (minutes):  15 Post-procedure details:    Post-sedation assessment completed:  04/20/2019 12:30 PM   Attendance: Constant attendance by certified staff until patient recovered     Recovery: Patient returned to pre-procedure baseline     Post-sedation assessments completed and reviewed: nausea/vomiting     Patient is stable for discharge or admission: yes   Comments:     Patient with episode of nausea and diaphoresis post-sedation.  Recovered to baseline   (including critical care time)  Medications Ordered in ED Medications  ondansetron (ZOFRAN) injection 4 mg (4 mg Intravenous Given 04/20/19 1023)  morphine 4 MG/ML injection 4 mg (4 mg Intravenous Given 04/20/19 1023)  ketamine (KETALAR) injection (199.6 mg Intravenous Given 04/20/19 1125)  lidocaine-EPINEPHrine-tetracaine (LET) solution (3 mLs Topical Given 04/20/19 1159)  0.9 %  sodium chloride infusion ( Intravenous Stopped 04/20/19 1300)  Tdap (BOOSTRIX) injection 0.5 mL (0.5 mLs Intramuscular Given 04/20/19 1354)  acetaminophen (TYLENOL) tablet 650 mg (650 mg Oral Given 04/20/19 1646)     Initial Impression / Assessment and Plan / ED Course  I have reviewed the triage vital signs and the nursing notes.  Pertinent labs & imaging results that were available during my care of the patient were reviewed by me and considered in my medical decision making (see chart for details).          Final Clinical Impressions(s) / ED Diagnoses   Final diagnoses:  Posterior dislocation of left elbow, initial encounter  Chin laceration, initial encounter    ED Discharge Orders    None       Loren RacerYelverton, Charelle Petrakis, MD  04/21/19 [phone]

## 2019-07-15 ENCOUNTER — Other Ambulatory Visit: Payer: Self-pay

## 2019-07-15 ENCOUNTER — Emergency Department (HOSPITAL_COMMUNITY)
Admission: EM | Admit: 2019-07-15 | Discharge: 2019-07-15 | Disposition: A | Discharge status: Home / Self Care | Payer: Medicaid Other | Attending: Emergency Medicine | Admitting: Emergency Medicine

## 2019-07-15 ENCOUNTER — Encounter (HOSPITAL_COMMUNITY): Payer: Self-pay

## 2019-07-15 DIAGNOSIS — Z20822 Contact with and (suspected) exposure to covid-19: Secondary | ICD-10-CM

## 2019-07-15 DIAGNOSIS — J45909 Unspecified asthma, uncomplicated: Secondary | ICD-10-CM | POA: Insufficient documentation

## 2019-07-15 DIAGNOSIS — Z20828 Contact with and (suspected) exposure to other viral communicable diseases: Secondary | ICD-10-CM | POA: Insufficient documentation

## 2019-07-15 NOTE — ED Triage Notes (Signed)
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

## 2019-07-15 NOTE — Discharge Instructions (Signed)
You have been diagnosed today with encounter for Covid test in an asymptomatic patient.  At this time there does not appear to be the presence of an emergent medical condition, however there is always the potential for conditions to change. Please read and follow the below instructions.  Please return to the Emergency Department immediately for any new or worsening symptoms. Please be sure to follow up with your Primary Care Provider within one week regarding your visit today; please call their office to schedule an appointment even if you are feeling better for a follow-up visit. Your Covid test will be available on your MyChart account in 1-2 days.  Continue self quarantine until your results are available.  Please read the additional information packets attached to your discharge summary.  Note: Portions of this text may have been transcribed using voice recognition software. Every effort was made to ensure accuracy; however, inadvertent computerized transcription errors may still be present.

## 2019-07-15 NOTE — ED Provider Notes (Signed)
[facility] DEPT Provider Note   CSN: [id number] Arrival date & time: 07/15/19  1406     History   Chief Complaint No chief complaint on file.   HPI Roberto Castro is a 23 y.o. male history of asthma, otherwise healthy no daily medication use presents today requesting Covid test.  Patient reports that his job has requested that he be tested for COVID-19 virus prior to returning to work.  Patient denies any exposure to the COVID-19 virus.  He reports that he works a few miles away and [employer] and has been in [place] for the past several days for Thanksgiving.  Patient reports that he is asymptomatic, he feels well and is at baseline he denies any concerns today.  He would like to be tested for COVID-19 and then discharged.  Specifically he denies fever/chills, headache, neck pain, chest pain/shortness of breath, cough, abdominal pain, nausea vomiting diarrhea, sick contacts or any additional concerns.      HPI  Past Medical History:  Diagnosis Date  . Asthma   . Seasonal allergies     There are no active problems to display for this patient.   History reviewed. No pertinent surgical history.      Home Medications    Prior to Admission medications   Medication Sig Start Date End Date Taking? Authorizing Provider  ondansetron (ZOFRAN) 4 MG tablet Take 1 tablet (4 mg total) by mouth every 8 (eight) hours as needed for nausea or vomiting. Patient not taking: Reported on 11/26/2017 08/14/16   Melony Overly, MD    Family History Family History  Family history unknown: Yes    Social History Social History   Tobacco Use  . Smoking status: Never Smoker  . Smokeless tobacco: Never Used  Substance Use Topics  . Alcohol use: Yes    Comment: occ  . Drug use: Yes    Types: Marijuana    Comment: sometimes      Allergies   Benadryl [diphenhydramine] and Ibuprofen   Review of Systems Review of Systems Ten  systems are reviewed and are negative for acute change except as noted in the HPI   Physical Exam Updated Vital Signs BP (!) 152/97 (BP Location: Right Arm)   Pulse 94   Temp 98.4 F (36.9 C) (Oral)   Resp 18   Ht 6\' 3"  (1.905 m)   Wt 94.3 kg   SpO2 99%   BMI 26.00 kg/m   Physical Exam Constitutional:      General: He is not in acute distress.    Appearance: Normal appearance. He is well-developed. He is not ill-appearing or diaphoretic.  HENT:     Head: Normocephalic and atraumatic.     Right Ear: External ear normal.     Left Ear: External ear normal.     Nose: Nose normal.  Eyes:     General: Vision grossly intact. Gaze aligned appropriately.     Pupils: Pupils are equal, round, and reactive to light.  Neck:     Musculoskeletal: Normal range of motion.     Trachea: Trachea and phonation normal. No tracheal deviation.  Pulmonary:     Effort: Pulmonary effort is normal. No respiratory distress.  Musculoskeletal: Normal range of motion.  Skin:    General: Skin is warm and dry.  Neurological:     Mental Status: He is alert.     GCS: GCS eye subscore is 4. GCS verbal subscore is 5. GCS motor subscore  is 6.     Comments: Speech is clear and goal oriented, follows commands Major Cranial nerves without deficit, no facial droop Moves extremities without ataxia, coordination intact  Psychiatric:        Behavior: Behavior normal.    ED Treatments / Results  Labs (all labs ordered are listed, but only abnormal results are displayed) Labs Reviewed  NOVEL CORONAVIRUS, NAA (HOSP ORDER, SEND-OUT TO REF LAB; TAT 18-24 HRS)    EKG None  Radiology No results found.  Procedures Procedures (including critical care time)  Medications Ordered in ED Medications - No data to display   Initial Impression / Assessment and Plan / ED Course  I have reviewed the triage vital signs and the nursing notes.  Pertinent labs & imaging results that were available during my care of  the patient were reviewed by me and considered in my medical decision making (see chart for details).    23 year old male presents today requesting Covid test for work, no exposures and no symptoms.  We will send the outpatient Covid test, patient will follow up on his MyChart account for results in 1-2 days.  Patient will self quarantine until results are available.  He is advised to wear his mask and follow-up with his PCP.  At this time there does not appear to be any evidence of an acute emergency medical condition and the patient appears stable for discharge with appropriate outpatient follow up. Diagnosis was discussed with patient who verbalizes understanding of care plan and is agreeable to discharge. I have discussed return precautions with patient who verbalizes understanding of return precautions. Patient encouraged to follow-up with their PCP. All questions answered.  Roberto Castro was evaluated in Emergency Department on 07/15/2019 for the symptoms described in the history of present illness. He was evaluated in the context of the global COVID-19 pandemic, which necessitated consideration that the patient might be at risk for infection with the SARS-CoV-2 virus that causes COVID-19. Institutional protocols and algorithms that pertain to the evaluation of patients at risk for COVID-19 are in a state of rapid change based on information released by regulatory bodies including the CDC and federal and state organizations. These policies and algorithms were followed during the patient's care in the ED.   Note: Portions of this report may have been transcribed using voice recognition software. Every effort was made to ensure accuracy; however, inadvertent computerized transcription errors may still be present. Final Clinical Impressions(s) / ED Diagnoses   Final diagnoses:  Encounter for screening laboratory testing for COVID-19 virus in asymptomatic patient    ED Discharge Orders    None        Bill Salinas, PA-C 07/15/19 1557    Rolan Bucco, MD 07/15/19 1735

## 2019-07-17 LAB — NOVEL CORONAVIRUS, NAA (HOSP ORDER, SEND-OUT TO REF LAB; TAT 18-24 HRS): SARS-CoV-2, NAA: NOT DETECTED

## 2019-09-20 IMAGING — DX DG ELBOW 2V*L*
2 series · 2 of 2 positions shown · non-contrast
Comparison: Radiographs of same day.

CLINICAL DATA: Status post left elbow reduction.

EXAM:
LEFT ELBOW - 2 VIEW

[elbow ap (1 of 2)]
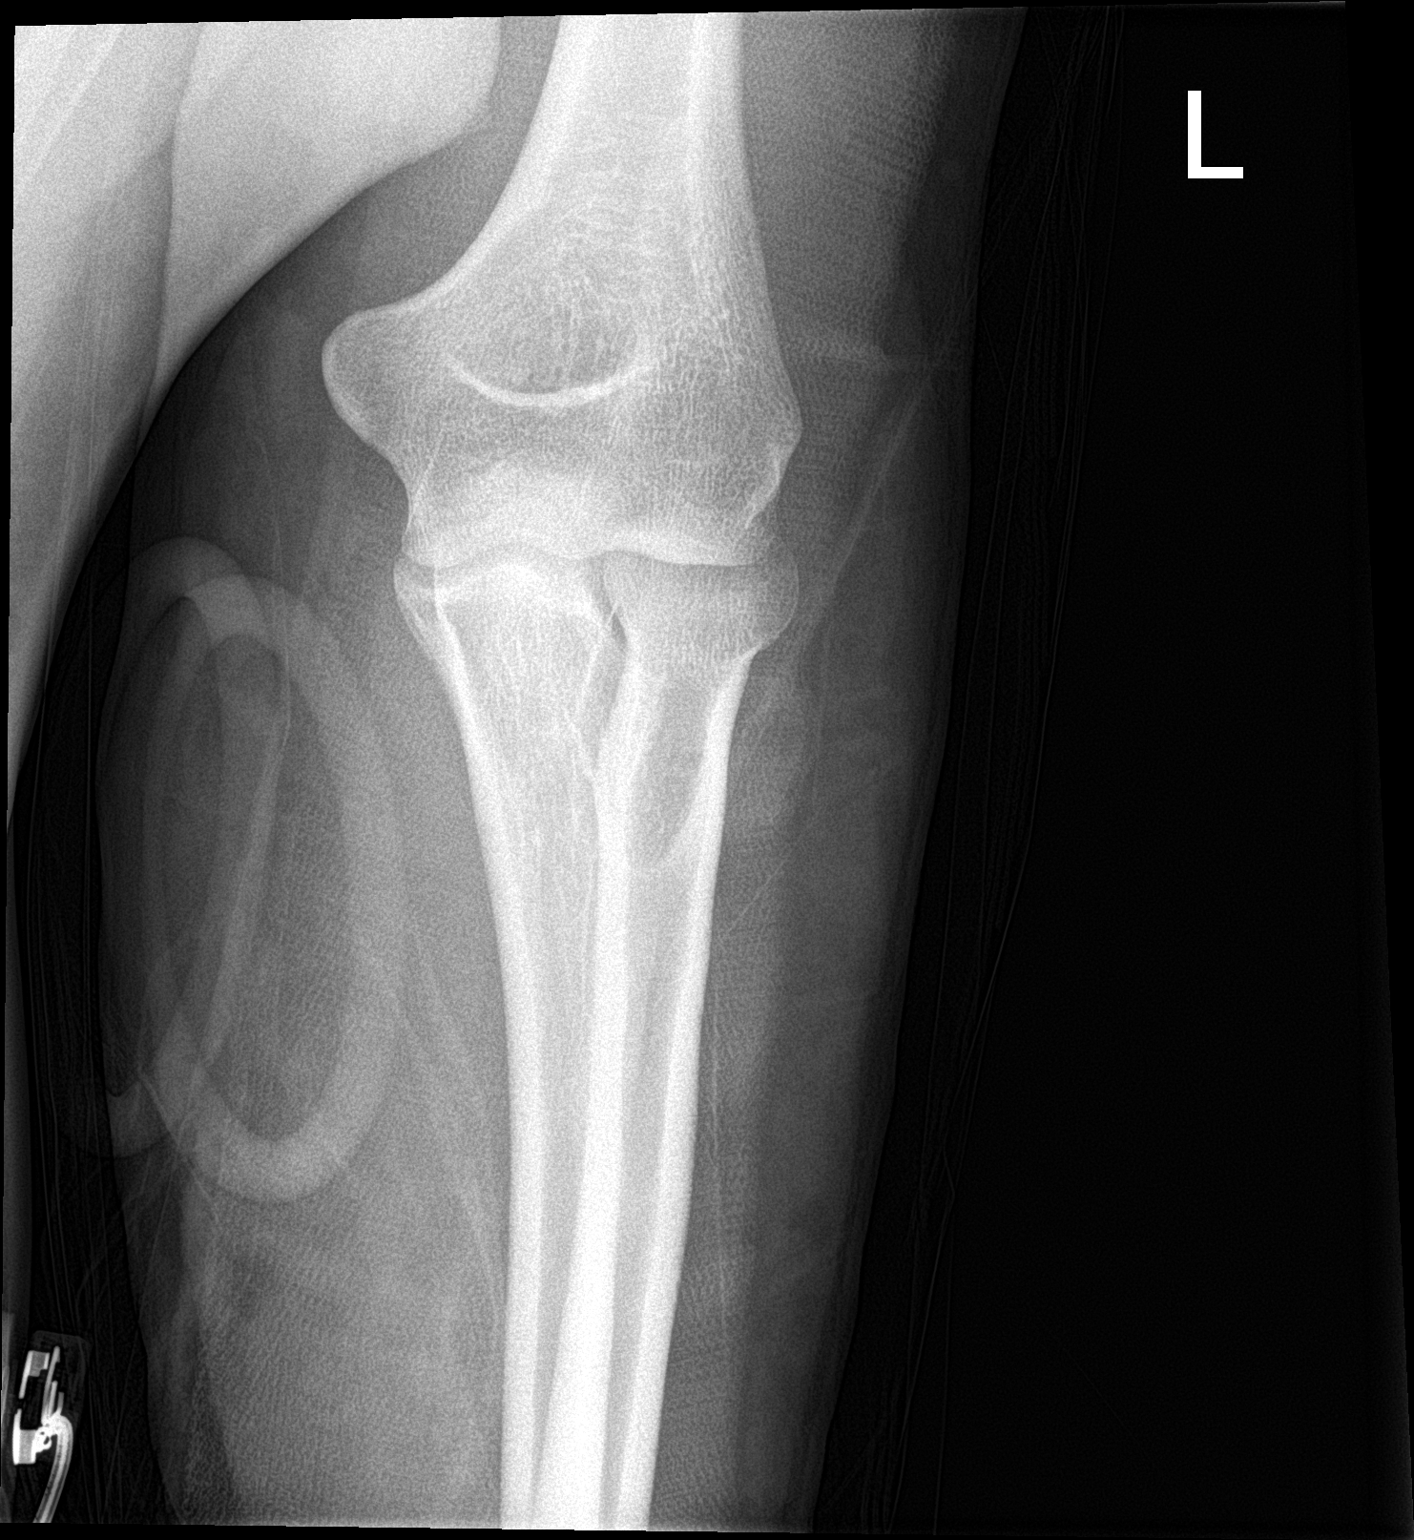

[elbow ap (2 of 2)]
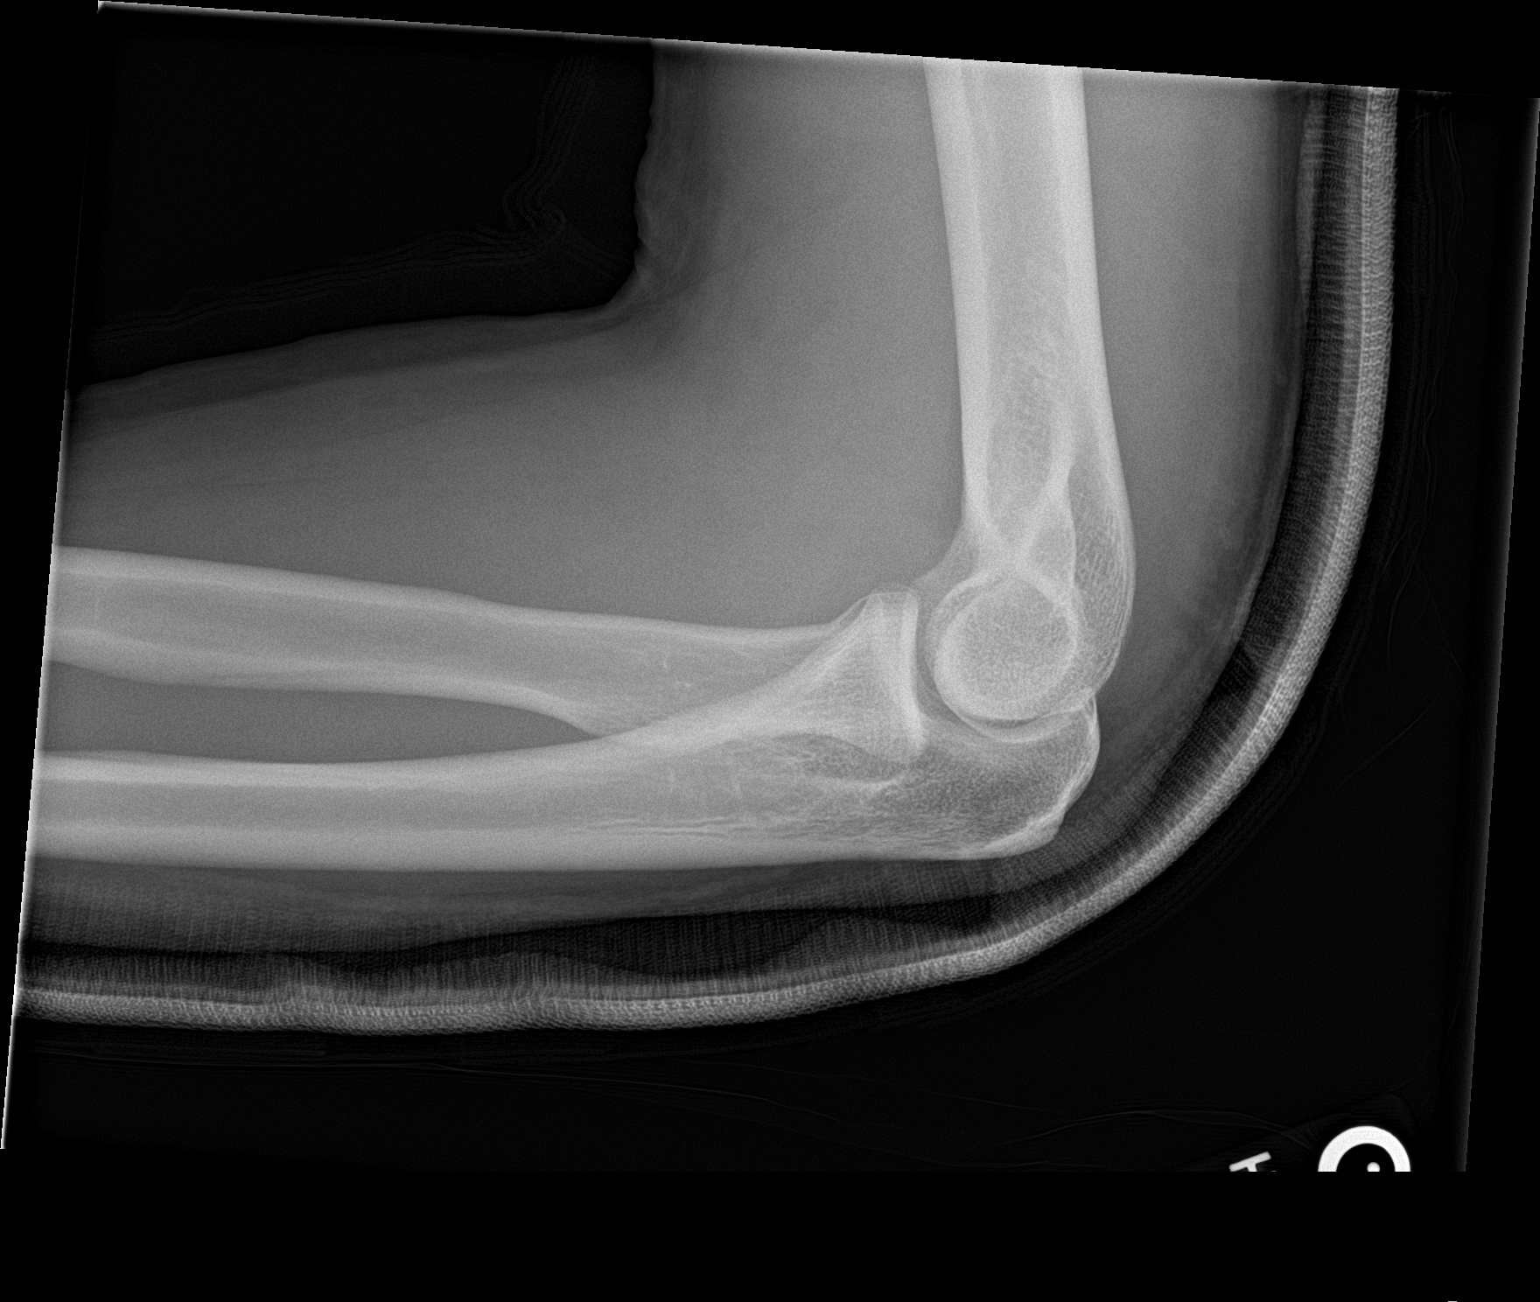

[2 of 2 positions shown; findings below may reference images not displayed]

FINDINGS: The left elbow has been splinted and immobilized. There is been
successful reduction of the proximal radial and ulnar dislocations
noted on prior exam. No definite fracture or dislocation is noted.
IMPRESSION: Successful reduction of proximal radioulnar dislocation noted on
prior exam. No definite fracture is noted.

## 2019-11-28 ENCOUNTER — Emergency Department (HOSPITAL_COMMUNITY)
Admission: EM | Admit: 2019-11-28 | Discharge: 2019-11-28 | Disposition: A | Discharge status: Home / Self Care | Payer: Medicaid Other | Attending: Emergency Medicine | Admitting: Emergency Medicine

## 2019-11-28 ENCOUNTER — Other Ambulatory Visit: Payer: Self-pay

## 2019-11-28 ENCOUNTER — Encounter (HOSPITAL_COMMUNITY): Payer: Self-pay

## 2019-11-28 DIAGNOSIS — Y929 Unspecified place or not applicable: Secondary | ICD-10-CM | POA: Insufficient documentation

## 2019-11-28 DIAGNOSIS — Y999 Unspecified external cause status: Secondary | ICD-10-CM | POA: Insufficient documentation

## 2019-11-28 DIAGNOSIS — J45909 Unspecified asthma, uncomplicated: Secondary | ICD-10-CM | POA: Insufficient documentation

## 2019-11-28 DIAGNOSIS — Y9367 Activity, basketball: Secondary | ICD-10-CM | POA: Insufficient documentation

## 2019-11-28 DIAGNOSIS — S01511A Laceration without foreign body of lip, initial encounter: Secondary | ICD-10-CM

## 2019-11-28 DIAGNOSIS — S0181XA Laceration without foreign body of other part of head, initial encounter: Secondary | ICD-10-CM

## 2019-11-28 DIAGNOSIS — W2209XA Striking against other stationary object, initial encounter: Secondary | ICD-10-CM | POA: Insufficient documentation

## 2019-11-28 MED ORDER — LIDOCAINE HCL (PF) 1 % IJ SOLN
20.0000 mL | Freq: Once | INTRAMUSCULAR | Status: AC
  Administered 2019-11-28: 21:00:00 20 mL
  Filled 2019-11-28: qty 30

## 2019-11-28 MED ORDER — OXYCODONE-ACETAMINOPHEN 5-325 MG PO TABS: 1.0000 | ORAL_TABLET | Freq: Four times a day (QID) | ORAL | 0 refills | Status: AC | PRN

## 2019-11-28 MED ORDER — OXYCODONE-ACETAMINOPHEN 5-325 MG PO TABS
1.0000 | ORAL_TABLET | Freq: Once | ORAL | Status: AC
  Administered 2019-11-28: 1 via ORAL
  Filled 2019-11-28: qty 1

## 2019-11-28 NOTE — ED Triage Notes (Signed)
Pt has laceration to his upper lip from hitting the pole, bleeding controlled at present

## 2019-11-28 NOTE — Discharge Instructions (Signed)
Please schedule follow-up appointment with the ear nose and throat doctor for follow-up regarding your lip laceration.  The sutures should be removed in 5 to 7 days.  If you are not able to get an appointment with them or if you desire, you can also follow-up with a primary care doctor or return to ER for suture removal and wound recheck.  If you develop redness, purulence, fever, return to ER at that time for wound recheck.  Please keep it clean.  Recommend soft foods for the next few days while this is healing.  Can run gentle water but no vigorous scrubbing.  Can take pain medicine as needed, note this can make dressings to be taken with driving or operating heavy machinery.

## 2019-11-28 NOTE — ED Provider Notes (Signed)
[facility]-EMERGENCY DEPT Provider Note   CSN: [id number] Arrival date & time: 11/28/19  1914     History No chief complaint on file.   Roberto Castro is a 24 y.o. male.  Presents to ER with lip laceration.  Playing basketball, accidentally hit his face on a pole.  No loss consciousness, bleeding controlled with direct pressure.  Laceration to right side of his upper lip.    Denies any chronic medical problems.  HPI     Past Medical History:  Diagnosis Date  . Asthma   . Seasonal allergies     There are no problems to display for this patient.   History reviewed. No pertinent surgical history.     Family History  Family history unknown: Yes    Social History   Tobacco Use  . Smoking status: Never Smoker  . Smokeless tobacco: Never Used  Substance Use Topics  . Alcohol use: Yes    Comment: occ  . Drug use: Yes    Types: Marijuana    Comment: sometimes     Home Medications Prior to Admission medications   Medication Sig Start Date End Date Taking? Authorizing Provider  ondansetron (ZOFRAN) 4 MG tablet Take 1 tablet (4 mg total) by mouth every 8 (eight) hours as needed for nausea or vomiting. Patient not taking: Reported on 11/26/2017 08/14/16   Charm Rings, MD  oxyCODONE-acetaminophen (PERCOCET) 5-325 MG tablet Take 1 tablet by mouth every 6 (six) hours as needed for up to 3 days for severe pain. 11/28/19 12/01/19  Milagros Loll, MD    Allergies    Benadryl [diphenhydramine] and Ibuprofen  Review of Systems   Review of Systems  Constitutional: Negative for chills and fever.  HENT: Positive for facial swelling. Negative for ear pain and sore throat.   Eyes: Negative for pain and visual disturbance.  Respiratory: Negative for cough and shortness of breath.   Cardiovascular: Negative for chest pain and palpitations.  Gastrointestinal: Negative for abdominal pain and vomiting.  Genitourinary: Negative for dysuria and hematuria.    Musculoskeletal: Negative for arthralgias and back pain.  Skin: Negative for color change and rash.  Neurological: Negative for seizures and syncope.  All other systems reviewed and are negative.   Physical Exam Updated Vital Signs BP 140/65   Pulse 80   Temp 98.8 F (37.1 C) (Oral)   Resp 16   Ht 6\' 3"  (1.905 m)   Wt 96.5 kg   SpO2 100%   BMI 26.60 kg/m   Physical Exam Constitutional:      Appearance: Normal appearance.  HENT:     Head: Normocephalic.     Mouth/Throat:      Comments: 2 cm laceration from base of right nare to right upper lip, includes vermilion border, extends deep through skin and into subcutaneous layer but does not extend to buccal mucosa Eyes:     Extraocular Movements: Extraocular movements intact.     Pupils: Pupils are equal, round, and reactive to light.  Cardiovascular:     Rate and Rhythm: Normal rate.     Pulses: Normal pulses.  Abdominal:     General: Abdomen is flat. There is no distension.  Musculoskeletal:        General: No deformity or signs of injury.  Skin:    General: Skin is warm and dry.     Capillary Refill: Capillary refill takes less than 2 seconds.  Neurological:     General: No focal  deficit present.     Mental Status: He is alert and oriented to person, place, and time.  Psychiatric:        Mood and Affect: Mood normal.        Behavior: Behavior normal.     ED Results / Procedures / Treatments   Labs (all labs ordered are listed, but only abnormal results are displayed) Labs Reviewed - No data to display  EKG None  Radiology No results found.  Procedures .Marland KitchenLaceration Repair  Date/Time: 11/28/2019 11:20 PM Performed by: Lucrezia Starch, MD Authorized by: Lucrezia Starch, MD   Consent:    Consent obtained:  Verbal   Consent given by:  Patient   Risks discussed:  Infection, need for additional repair, nerve damage, poor wound healing, poor cosmetic result, pain, retained foreign body, tendon damage  and vascular damage   Alternatives discussed:  No treatment Anesthesia (see MAR for exact dosages):    Anesthesia method:  None Laceration details:    Location:  Lip   Lip location:  Upper exterior lip   Length (cm):  3 Repair type:    Repair type:  Complex Pre-procedure details:    Preparation:  Patient was prepped and draped in usual sterile fashion Exploration:    Contaminated: no   Treatment:    Area cleansed with:  Betadine   Amount of cleaning:  Extensive   Irrigation solution:  Sterile saline   Irrigation method:  Syringe   Debridement:  None Subcutaneous repair:    Suture size:  5-0   Suture material:  Vicryl   Number of sutures:  4 Skin repair:    Repair method:  Sutures   Suture size:  5-0   Suture material:  Nylon   Number of sutures:  5 Approximation:    Approximation:  Close   Vermilion border: well-aligned   Post-procedure details:    Dressing:  Open (no dressing)   Patient tolerance of procedure:  Tolerated well, no immediate complications   (including critical care time)  Medications Ordered in ED Medications  oxyCODONE-acetaminophen (PERCOCET/ROXICET) 5-325 MG per tablet 1 tablet (1 tablet Oral Given 11/28/19 2127)  lidocaine (PF) (XYLOCAINE) 1 % injection 20 mL (20 mLs Infiltration Given 11/28/19 2128)    ED Course  I have reviewed the triage vital signs and the nursing notes.  Pertinent labs & imaging results that were available during my care of the patient were reviewed by me and considered in my medical decision making (see chart for details).    MDM Rules/Calculators/A&P                      24 year old male presenting to ER with right upper lip laceration.  Involve vermilion border, did not extend to philtrum. Good approximation. Rec f/u with ENT for wound recheck and suture removal. Reviewed wound care instructions, discharged home.    Final Clinical Impression(s) / ED Diagnoses Final diagnoses:  Lip laceration, initial encounter    Facial laceration, initial encounter    Rx / DC Orders ED Discharge Orders         Ordered    oxyCODONE-acetaminophen (PERCOCET) 5-325 MG tablet  Every 6 hours PRN     11/28/19 2226           Lucrezia Starch, MD 11/28/19 2322

## 2019-11-28 NOTE — ED Notes (Signed)
Suture cart at bedside 

## 2019-12-16 ENCOUNTER — Other Ambulatory Visit: Payer: Self-pay

## 2019-12-16 ENCOUNTER — Encounter (HOSPITAL_COMMUNITY): Payer: Self-pay

## 2019-12-16 ENCOUNTER — Emergency Department (HOSPITAL_COMMUNITY)
Admission: EM | Admit: 2019-12-16 | Discharge: 2019-12-16 | Disposition: A | Discharge status: Home / Self Care | Payer: Medicaid Other | Attending: Emergency Medicine | Admitting: Emergency Medicine

## 2019-12-16 DIAGNOSIS — S01511D Laceration without foreign body of lip, subsequent encounter: Secondary | ICD-10-CM | POA: Insufficient documentation

## 2019-12-16 DIAGNOSIS — J45909 Unspecified asthma, uncomplicated: Secondary | ICD-10-CM | POA: Insufficient documentation

## 2019-12-16 DIAGNOSIS — X58XXXD Exposure to other specified factors, subsequent encounter: Secondary | ICD-10-CM | POA: Insufficient documentation

## 2019-12-16 DIAGNOSIS — Z4802 Encounter for removal of sutures: Secondary | ICD-10-CM

## 2019-12-16 NOTE — ED Notes (Signed)
Cant DC registration in chart

## 2019-12-16 NOTE — ED Provider Notes (Signed)
[place] DEPT Provider Note   CSN: [id number] Arrival date & time: 12/16/19  1515     History Chief Complaint  Patient presents with  . Suture / Staple Removal    Roberto Castro is a 24 y.o. male.   Suture / Staple Removal This is a new problem. The problem has been gradually improving. Pertinent negatives include no chest pain, no abdominal pain, no headaches and no shortness of breath. Nothing aggravates the symptoms. Nothing relieves the symptoms. He has tried nothing for the symptoms.       Past Medical History:  Diagnosis Date  . Asthma   . Seasonal allergies     There are no problems to display for this patient.   History reviewed. No pertinent surgical history.     Family History  Family history unknown: Yes    Social History   Tobacco Use  . Smoking status: Never Smoker  . Smokeless tobacco: Never Used  Substance Use Topics  . Alcohol use: Yes    Comment: occ  . Drug use: Yes    Types: Marijuana    Comment: sometimes     Home Medications Prior to Admission medications   Medication Sig Start Date End Date Taking? Authorizing Provider  ondansetron (ZOFRAN) 4 MG tablet Take 1 tablet (4 mg total) by mouth every 8 (eight) hours as needed for nausea or vomiting. Patient not taking: Reported on 11/26/2017 08/14/16   Melony Overly, MD    Allergies    Benadryl [diphenhydramine] and Ibuprofen  Review of Systems   Review of Systems  Constitutional: Negative for chills and fever.  Respiratory: Negative for shortness of breath.   Cardiovascular: Negative for chest pain.  Gastrointestinal: Negative for abdominal pain.  Skin: Positive for wound.  Neurological: Negative for headaches.    Physical Exam Updated Vital Signs BP 131/61 (BP Location: Right Arm)   Pulse 85   Temp 98.3 F (36.8 C) (Oral)   Resp 16   SpO2 100%   Physical Exam Vitals and nursing note reviewed.  Constitutional:      General: He is not in  acute distress.    Appearance: He is well-developed. He is not diaphoretic.  HENT:     Head: Normocephalic.     Comments: 4 sutures in place left lip Eyes:     General: No scleral icterus.    Conjunctiva/sclera: Conjunctivae normal.  Cardiovascular:     Rate and Rhythm: Normal rate and regular rhythm.     Heart sounds: Normal heart sounds.  Pulmonary:     Effort: Pulmonary effort is normal. No respiratory distress.     Breath sounds: Normal breath sounds.  Abdominal:     Palpations: Abdomen is soft.     Tenderness: There is no abdominal tenderness.  Musculoskeletal:     Cervical back: Normal range of motion and neck supple.  Skin:    General: Skin is warm and dry.  Neurological:     Mental Status: He is alert.  Psychiatric:        Behavior: Behavior normal.     ED Results / Procedures / Treatments   Labs (all labs ordered are listed, but only abnormal results are displayed) Labs Reviewed - No data to display  EKG None  Radiology No results found.  Procedures .Suture Removal  Date/Time: 12/16/2019 4:25 PM Performed by: Margarita Mail, PA-C Authorized by: Margarita Mail, PA-C   Consent:    Consent obtained:  Verbal   Consent  given by:  Patient   Risks discussed:  Bleeding, pain and wound separation Location:    Location:  Head/neck   Head/neck location: lip. Procedure details:    Wound appearance:  No signs of infection, good wound healing, clean and pink   Number of sutures removed:  4   Number of staples removed:  4 Post-procedure details:    Patient tolerance of procedure:  Tolerated well, no immediate complications   (including critical care time)  Medications Ordered in ED Medications - No data to display  ED Course  I have reviewed the triage vital signs and the nursing notes.  Pertinent labs & imaging results that were available during my care of the patient were reviewed by me and considered in my medical decision making (see chart for  details).    MDM Rules/Calculators/A&P                      Staple removal   Pt to ER for staple/suture removal and wound check as above. Procedure tolerated well. Vitals normal, no signs of infection. Scar minimization & return precautions given at dc.   Final Clinical Impression(s) / ED Diagnoses Final diagnoses:  Visit for suture removal    Rx / DC Orders ED Discharge Orders    None       Arthor Captain, PA-C 12/16/19 2154    Melene Plan, DO 12/17/19 1506

## 2019-12-16 NOTE — ED Triage Notes (Signed)
Pt arrived POV ambulatory into ED CC Stich removal in upper lip placed 4/14 here at [facility]. Pt present with no other complaints at this time. VSS Afebrile.

## 2019-12-16 NOTE — ED Notes (Signed)
Pt verbalizes understanding of DC instructions. Pt belongings returned and is ambulatory out of ED.  

## 2020-06-04 ENCOUNTER — Other Ambulatory Visit: Payer: Self-pay

## 2020-06-04 ENCOUNTER — Encounter (HOSPITAL_COMMUNITY): Payer: Self-pay | Admitting: Emergency Medicine

## 2020-06-04 ENCOUNTER — Emergency Department (HOSPITAL_COMMUNITY)
Admission: EM | Admit: 2020-06-04 | Discharge: 2020-06-04 | Disposition: A | Discharge status: Home / Self Care | Payer: Self-pay | Attending: Emergency Medicine | Admitting: Emergency Medicine

## 2020-06-04 DIAGNOSIS — Z7251 High risk heterosexual behavior: Secondary | ICD-10-CM | POA: Insufficient documentation

## 2020-06-04 DIAGNOSIS — Z113 Encounter for screening for infections with a predominantly sexual mode of transmission: Secondary | ICD-10-CM | POA: Insufficient documentation

## 2020-06-04 DIAGNOSIS — J45909 Unspecified asthma, uncomplicated: Secondary | ICD-10-CM | POA: Insufficient documentation

## 2020-06-04 DIAGNOSIS — Z202 Contact with and (suspected) exposure to infections with a predominantly sexual mode of transmission: Secondary | ICD-10-CM | POA: Insufficient documentation

## 2020-06-04 LAB — URINALYSIS, ROUTINE W REFLEX MICROSCOPIC
Bilirubin Urine: NEGATIVE
Glucose, UA: NEGATIVE mg/dL
Hgb urine dipstick: NEGATIVE
Ketones, ur: NEGATIVE mg/dL
Leukocytes,Ua: NEGATIVE
Nitrite: NEGATIVE
Protein, ur: NEGATIVE mg/dL
Specific Gravity, Urine: 1.015 (ref 1.005–1.030)
pH: 7 (ref 5.0–8.0)

## 2020-06-04 LAB — HIV ANTIBODY (ROUTINE TESTING W REFLEX): HIV Screen 4th Generation wRfx: NONREACTIVE

## 2020-06-04 NOTE — Discharge Instructions (Signed)
The test results will come back in a few days.  You can check your MyChart  for the results.

## 2020-06-04 NOTE — ED Provider Notes (Signed)
[facility]-EMERGENCY DEPT Provider Note   CSN: [id number] Arrival date & time: 06/04/20  1007     History Chief Complaint  Patient presents with  . STD check    Roberto Castro is a 24 y.o. male.  HPI   Patient states he is here to get an STD check.  Patient states he had unprotected sex recently and it was with a new partner.  He denies having any symptoms.  He is not having any discharge.  He denies any pain.  He denies any ulcerations.  Patient states he just wants to be safe and get a checkup.  He states usually he gets some blood tests and they give him a shot.  Past Medical History:  Diagnosis Date  . Asthma   . Seasonal allergies     There are no problems to display for this patient.   History reviewed. No pertinent surgical history.     Family History  Family history unknown: Yes    Social History   Tobacco Use  . Smoking status: Never Smoker  . Smokeless tobacco: Never Used  Vaping Use  . Vaping Use: Never used  Substance Use Topics  . Alcohol use: Yes    Comment: occ  . Drug use: Yes    Types: Marijuana    Comment: sometimes     Home Medications Prior to Admission medications   Medication Sig Start Date End Date Taking? Authorizing Provider  ondansetron (ZOFRAN) 4 MG tablet Take 1 tablet (4 mg total) by mouth every 8 (eight) hours as needed for nausea or vomiting. Patient not taking: Reported on 11/26/2017 08/14/16   Charm Rings, MD    Allergies    Benadryl [diphenhydramine] and Ibuprofen  Review of Systems   Review of Systems  Constitutional: Negative for fever.  Gastrointestinal: Negative for abdominal pain.  Genitourinary: Negative for dysuria.    Physical Exam Updated Vital Signs BP 134/77 (BP Location: Left Arm)   Pulse 90   Temp 99.1 F (37.3 C) (Oral)   Resp 16   Ht 1.905 m (6\' 3" )   Wt 95.3 kg   SpO2 97%   BMI 26.25 kg/m   Physical Exam Vitals and nursing note reviewed.  Constitutional:       General: He is not in acute distress.    Appearance: He is well-developed.  HENT:     Head: Normocephalic and atraumatic.     Right Ear: External ear normal.     Left Ear: External ear normal.  Eyes:     General: No scleral icterus.       Right eye: No discharge.        Left eye: No discharge.     Conjunctiva/sclera: Conjunctivae normal.  Neck:     Trachea: No tracheal deviation.  Cardiovascular:     Rate and Rhythm: Normal rate.  Pulmonary:     Effort: Pulmonary effort is normal. No respiratory distress.     Breath sounds: No stridor.  Abdominal:     General: There is no distension.  Musculoskeletal:        General: No swelling or deformity.     Cervical back: Neck supple.  Skin:    General: Skin is warm and dry.     Findings: No rash.  Neurological:     Mental Status: He is alert.     Cranial Nerves: Cranial nerve deficit: no gross deficits.     ED Results / Procedures / Treatments  Labs (all labs ordered are listed, but only abnormal results are displayed) Labs Reviewed  RPR  URINALYSIS, ROUTINE W REFLEX MICROSCOPIC  HIV ANTIBODY (ROUTINE TESTING W REFLEX)  GC/CHLAMYDIA PROBE AMP ([place]) NOT AT [facility]     Procedures Procedures (including critical care time)  Medications Ordered in ED Medications - No data to display  ED Course  I have reviewed the triage vital signs and the nursing notes.  Pertinent labs & imaging results that were available during my care of the patient were reviewed by me and considered in my medical decision making (see chart for details).    MDM Rules/Calculators/A&P                          Patient presented to the emergency room to get a checkup.  He does not have any emergent complaints.  He denies any symptoms whatsoever.  Patient requested STD testing.  We will send off a urine for GC and chlamydia.  We will also send off HIV and syphilis testing.  Explained to the patient that in the future he can to a primary care doctor or  to the health department that should be able to provide the same services   Final Clinical Impression(s) / ED Diagnoses Final diagnoses:  Unprotected sex    Rx / DC Orders ED Discharge Orders    None       Linwood Dibbles, MD 06/04/20 1135

## 2020-06-04 NOTE — ED Triage Notes (Signed)
Pt states that he has had unprotected sex and would like to  Be checked for stds. Denies symptoms. Alert and oriented.

## 2020-06-05 LAB — GC/CHLAMYDIA PROBE AMP (~~LOC~~) NOT AT ARMC
Chlamydia: NEGATIVE
Comment: NEGATIVE
Comment: NORMAL
Neisseria Gonorrhea: NEGATIVE

## 2020-06-05 LAB — RPR: RPR Ser Ql: NONREACTIVE

## 2020-06-08 ENCOUNTER — Emergency Department (HOSPITAL_COMMUNITY)
Admission: EM | Admit: 2020-06-08 | Discharge: 2020-06-08 | Disposition: A | Discharge status: Home / Self Care | Payer: Medicaid Other | Attending: Emergency Medicine | Admitting: Emergency Medicine

## 2020-06-08 ENCOUNTER — Other Ambulatory Visit: Payer: Self-pay

## 2020-06-08 ENCOUNTER — Encounter (HOSPITAL_COMMUNITY): Payer: Self-pay

## 2020-06-08 DIAGNOSIS — B349 Viral infection, unspecified: Secondary | ICD-10-CM | POA: Insufficient documentation

## 2020-06-08 DIAGNOSIS — J45909 Unspecified asthma, uncomplicated: Secondary | ICD-10-CM | POA: Insufficient documentation

## 2020-06-08 DIAGNOSIS — Z20822 Contact with and (suspected) exposure to covid-19: Secondary | ICD-10-CM | POA: Insufficient documentation

## 2020-06-08 LAB — GROUP A STREP BY PCR: Group A Strep by PCR: NOT DETECTED

## 2020-06-08 LAB — RESPIRATORY PANEL BY RT PCR (FLU A&B, COVID)
Influenza A by PCR: NEGATIVE
Influenza B by PCR: NEGATIVE
SARS Coronavirus 2 by RT PCR: NEGATIVE

## 2020-06-08 MED ORDER — ACETAMINOPHEN 325 MG PO TABS
650.0000 mg | ORAL_TABLET | Freq: Once | ORAL | Status: AC
  Administered 2020-06-08: 650 mg via ORAL
  Filled 2020-06-08: qty 2

## 2020-06-08 NOTE — Discharge Instructions (Addendum)
Home to quarantine pending your Covid test results. Take Tylenol as needed as directed for fever and body aches. After you have recovered from your illness, recommend Covid and flu vaccines.

## 2020-06-08 NOTE — ED Provider Notes (Signed)
[facility]-EMERGENC[facility] DEPT Provider Note   CSN: [id number] Arrival date & time: 06/08/20  1753     History Chief Complaint  Patient presents with  . Fever  . Back Pain    Roberto Castro is a 24 y.o. male.  24 year old male presents with complaint of body aches, fevers, chills, sore throat and feeling generally unwell.  Patient states symptoms started yesterday after he woke up from a nap.  No known sick contacts.  Patient is not vaccinated against COVID-19.  No other complaints or concerns today.        Past Medical History:  Diagnosis Date  . Asthma   . Seasonal allergies     There are no problems to display for this patient.   History reviewed. No pertinent surgical history.     Family History  Family history unknown: Yes    Social History   Tobacco Use  . Smoking status: Never Smoker  . Smokeless tobacco: Never Used  Vaping Use  . Vaping Use: Never used  Substance Use Topics  . Alcohol use: Yes    Comment: occ  . Drug use: Yes    Types: Marijuana    Comment: sometimes     Home Medications Prior to Admission medications   Medication Sig Start Date End Date Taking? Authorizing Provider  ondansetron (ZOFRAN) 4 MG tablet Take 1 tablet (4 mg total) by mouth every 8 (eight) hours as needed for nausea or vomiting. Patient not taking: Reported on 11/26/2017 08/14/16   Charm Rings, MD    Allergies    Benadryl [diphenhydramine] and Ibuprofen  Review of Systems   Review of Systems  Constitutional: Positive for chills and fever.  HENT: Positive for sore throat. Negative for congestion, sinus pressure, sinus pain, sneezing, trouble swallowing and voice change.   Respiratory: Positive for cough.   Cardiovascular: Negative for chest pain.  Gastrointestinal: Negative for diarrhea, nausea and vomiting.  Musculoskeletal: Positive for arthralgias and myalgias.  Skin: Negative for rash and wound.  Allergic/Immunologic: Negative for  immunocompromised state.  Neurological: Positive for headaches. Negative for weakness.  Hematological: Positive for adenopathy.  Psychiatric/Behavioral: Negative for confusion.  All other systems reviewed and are negative.   Physical Exam Updated Vital Signs BP 127/77   Pulse 97   Temp (!) 100.8 F (38.2 C) (Oral)   Resp 19   SpO2 96%   Physical Exam Vitals and nursing note reviewed.  Constitutional:      General: He is not in acute distress.    Appearance: He is well-developed. He is not diaphoretic.  HENT:     Head: Normocephalic and atraumatic.     Right Ear: Tympanic membrane and ear canal normal.     Left Ear: Tympanic membrane and ear canal normal.     Nose: Nose normal.     Mouth/Throat:     Mouth: Mucous membranes are moist.     Pharynx: Posterior oropharyngeal erythema present. No oropharyngeal exudate.  Eyes:     Conjunctiva/sclera: Conjunctivae normal.  Cardiovascular:     Rate and Rhythm: Normal rate and regular rhythm.     Pulses: Normal pulses.     Heart sounds: Normal heart sounds.  Pulmonary:     Effort: Pulmonary effort is normal.     Breath sounds: Normal breath sounds.  Musculoskeletal:     Cervical back: Neck supple.  Lymphadenopathy:     Cervical: No cervical adenopathy.  Skin:    General: Skin is warm and  dry.     Findings: No erythema or rash.  Neurological:     Mental Status: He is alert and oriented to person, place, and time.  Psychiatric:        Behavior: Behavior normal.     ED Results / Procedures / Treatments   Labs (all labs ordered are listed, but only abnormal results are displayed) Labs Reviewed  GROUP A STREP BY PCR  RESPIRATORY PANEL BY RT PCR (FLU A&B, COVID)    EKG None  Radiology No results found.  Procedures Procedures (including critical care time)  Medications Ordered in ED Medications  acetaminophen (TYLENOL) tablet 650 mg (650 mg Oral Given 06/08/20 1909)    ED Course  I have reviewed the triage  vital signs and the nursing notes.  Pertinent labs & imaging results that were available during my care of the patient were reviewed by me and considered in my medical decision making (see chart for details).  Clinical Course as of Jun 08 2040  Wynelle Link Jun 08, 2020  [id number] 24 year old male with complaint of fever, chills, body aches, sore throat and lymphadenopathy. Patient was positive for COVID-19 in July 2020, has not been vaccinated, no known sick contacts. Vitals reviewed.  Mildly febrile with temp of 100.8 on triage, was given Tylenol. On exam, patient appears to feel unwell however is nontoxic and in no distress.  He has mild erythema the oropharynx without exudate, rapid strep is negative.  Lungs are clear, exam otherwise unremarkable. Respiratory panel pending, advised patient to quarantine, return for worsening or concerning symptoms otherwise recheck with PCP.   [LM]    Clinical Course User Index [LM] Alden Hipp   MDM Rules/Calculators/A&P                          Final Clinical Impression(s) / ED Diagnoses Final diagnoses:  Viral illness    Rx / DC Orders ED Discharge Orders    None       Alden Hipp 06/08/20 2041    Little, Ambrose Finland, MD 06/11/20 1531

## 2020-06-08 NOTE — ED Triage Notes (Addendum)
Pt arrived via walk in, fevers and body aches x3 days. Denies any sick contacts. Denies any other sx. Believes he has the flu.

## 2021-08-27 ENCOUNTER — Other Ambulatory Visit: Payer: Self-pay

## 2021-08-27 ENCOUNTER — Encounter (HOSPITAL_COMMUNITY): Payer: Self-pay

## 2021-08-27 ENCOUNTER — Emergency Department (HOSPITAL_COMMUNITY)
Admission: EM | Admit: 2021-08-27 | Discharge: 2021-08-28 | Disposition: A | Discharge status: Home / Self Care | Payer: Self-pay | Attending: Emergency Medicine | Admitting: Emergency Medicine

## 2021-08-27 ENCOUNTER — Emergency Department (HOSPITAL_COMMUNITY): Payer: Self-pay

## 2021-08-27 DIAGNOSIS — Y9367 Activity, basketball: Secondary | ICD-10-CM | POA: Insufficient documentation

## 2021-08-27 DIAGNOSIS — M79672 Pain in left foot: Secondary | ICD-10-CM | POA: Insufficient documentation

## 2021-08-27 DIAGNOSIS — X501XXA Overexertion from prolonged static or awkward postures, initial encounter: Secondary | ICD-10-CM | POA: Insufficient documentation

## 2021-08-27 DIAGNOSIS — S92355A Nondisplaced fracture of fifth metatarsal bone, left foot, initial encounter for closed fracture: Secondary | ICD-10-CM | POA: Insufficient documentation

## 2021-08-27 NOTE — ED Provider Triage Note (Signed)
Emergency Medicine Provider Triage Evaluation Note  Roberto Castro , a 26 y.o. male  was evaluated in triage.  Pt complains of an onset, constant, sharp, left lateral foot pain that began earlier tonight while playing basketball.  Patient states that a couple weeks ago he was playing basketball and had some mild pain to this area however since that time has not had any issues.  Tonight he went up to shoot a basket when he heard a pop in his foot has been having pain since that time.  Has been unable to bear weight on it since that time.  Has not taken anything for pain prior to arrival.  Review of Systems  Positive: + foot pain Negative: - weakness, numbness, tingling  Physical Exam  BP 130/73 (BP Location: Left Arm)    Pulse 93    Temp 98 F (36.7 C) (Oral)    Resp 18    Ht 6\' 3"  (1.905 m)    Wt 92.1 kg    SpO2 99%    BMI 25.37 kg/m  Gen:   Awake, no distress   Resp:  Normal effort  MSK:   Moves extremities without difficulty  Other:  + swelling and TTP to left 5th metatarsal. 2+ Radial pulse.   Medical Decision Making  Medically screening exam initiated at 11:15 PM.  Appropriate orders placed.  TANUJ MULLENS was informed that the remainder of the evaluation will be completed by another provider, this initial triage assessment does not replace that evaluation, and the importance of remaining in the ED until their evaluation is complete.     Roberto Hai, PA-C 08/27/21 2316

## 2021-08-27 NOTE — ED Triage Notes (Signed)
Pt reports left foot injury while playing basketball where he felt a "pop".

## 2021-08-28 MED ORDER — NAPROXEN 500 MG PO TABS
500.0000 mg | ORAL_TABLET | Freq: Once | ORAL | Status: AC
  Administered 2021-08-28: 500 mg via ORAL
  Filled 2021-08-28: qty 1

## 2021-08-28 MED ORDER — HYDROCODONE-ACETAMINOPHEN 5-325 MG PO TABS
1.0000 | ORAL_TABLET | Freq: Once | ORAL | Status: AC
  Administered 2021-08-28: 1 via ORAL
  Filled 2021-08-28: qty 1

## 2021-08-28 MED ORDER — HYDROCODONE-ACETAMINOPHEN 5-325 MG PO TABS: 1.0000 | ORAL_TABLET | Freq: Four times a day (QID) | ORAL | 0 refills | Status: AC | PRN

## 2021-08-28 NOTE — ED Provider Notes (Signed)
[facility]-EMERGENCY DEPT Provider Note   CSN: [id number] Arrival date & time: 08/27/21  2234     History  Chief Complaint  Patient presents with   Foot Injury    Roberto Castro is a 26 y.o. male.  The history is provided by the patient. No language interpreter was used.  Foot Injury Location:  Foot Injury: no   Foot location:  L foot Pain details:    Quality:  Sharp   Severity:  Moderate   Onset quality:  Sudden   Duration:  1 day   Timing:  Constant   Progression:  Unchanged Chronicity:  New Prior injury to area:  No Worsened by:  Bearing weight Ineffective treatments:  None tried Associated symptoms: decreased ROM, fever and swelling   Associated symptoms: no numbness, no stiffness and no tingling       Home Medications Prior to Admission medications   Medication Sig Start Date End Date Taking? Authorizing Provider  HYDROcodone-acetaminophen (NORCO/VICODIN) 5-325 MG tablet Take 1 tablet by mouth every 6 (six) hours as needed for severe pain. 08/28/21  Yes Antony Madura, PA-C  ondansetron (ZOFRAN) 4 MG tablet Take 1 tablet (4 mg total) by mouth every 8 (eight) hours as needed for nausea or vomiting. Patient not taking: Reported on 11/26/2017 08/14/16   Charm Rings, MD      Allergies    Benadryl [diphenhydramine] and Ibuprofen    Review of Systems   Review of Systems  Constitutional:  Positive for fever.  Musculoskeletal:  Negative for stiffness.  Ten systems reviewed and are negative for acute change, except as noted in the HPI.    Physical Exam Updated Vital Signs BP 130/73 (BP Location: Left Arm)    Pulse 93    Temp 98 F (36.7 C) (Oral)    Resp 18    Ht 6\' 3"  (1.905 m)    Wt 92.1 kg    SpO2 99%    BMI 25.37 kg/m  Physical Exam Vitals and nursing note reviewed.  Constitutional:      General: He is not in acute distress.    Appearance: He is well-developed. He is not diaphoretic.     Comments: Nontoxic-appearing  HENT:      Head: Normocephalic and atraumatic.  Eyes:     General: No scleral icterus.    Conjunctiva/sclera: Conjunctivae normal.  Cardiovascular:     Rate and Rhythm: Normal rate and regular rhythm.     Pulses: Normal pulses.     Comments: DP pulse 2+ in the left lower extremity Pulmonary:     Effort: Pulmonary effort is normal. No respiratory distress.  Musculoskeletal:     Cervical back: Normal range of motion.     Comments: Tenderness to palpation along the dorsal lateral aspect of the left foot with point tenderness and swelling at the base of the fifth metatarsal consistent with x-ray findings.  No crepitus or deformity.  Skin:    General: Skin is warm and dry.     Coloration: Skin is not pale.     Findings: No erythema or rash.  Neurological:     Mental Status: He is alert and oriented to person, place, and time.     Coordination: Coordination normal.     Comments: Sensation to light touch intact.  Patient able to wiggle all toes.  Psychiatric:        Behavior: Behavior normal.    ED Results / Procedures / Treatments   Labs (all  labs ordered are listed, but only abnormal results are displayed) Labs Reviewed - No data to display  EKG None  Radiology DG Foot Complete Left  Result Date: 08/27/2021 CLINICAL DATA:  Foot pain. EXAM: LEFT FOOT - COMPLETE 3+ VIEW COMPARISON:  None. FINDINGS: There is an acute transverse nondisplaced fracture through the proximal aspect of the fifth metatarsal. No dislocation. Joint spaces are well maintained. There is soft tissue swelling overlying the fracture. There are mild degenerative changes at the first metatarsophalangeal joint. IMPRESSION: Acute nondisplaced fracture proximal fifth metatarsal. Electronically Signed   By: Darliss Cheney M.D.   On: 08/27/2021 23:36    Procedures Procedures    Medications Ordered in ED Medications  HYDROcodone-acetaminophen (NORCO/VICODIN) 5-325 MG per tablet 1 tablet (1 tablet Oral Given 08/28/21 0142)   naproxen (NAPROSYN) tablet 500 mg (500 mg Oral Given 08/28/21 0142)    ED Course/ Medical Decision Making/ A&P                           Medical Decision Making  Patient presents to the emergency department for evaluation of L lateral foot pain. Patient neurovascularly intact on exam. Imaging results reviewed by me; notable for fx at the base of the 5th metatarsal. Agree with radiology interpretation. No swelling, erythema, heat to touch to the affected area; no concern for septic joint. Compartments in the affected extremity are soft.   Patient placed in a posterior short leg splint.  He has been given crutches to remain nonweightbearing.  Will refer to orthopedics to facilitate follow-up and ensure proper healing of the patient's broken bone.  Return precautions discussed and provided. Patient discharged in stable condition with no unaddressed concerns.        Final Clinical Impression(s) / ED Diagnoses Final diagnoses:  Nondisplaced fracture of fifth metatarsal bone, left foot, initial encounter for closed fracture    Rx / DC Orders ED Discharge Orders          Ordered    HYDROcodone-acetaminophen (NORCO/VICODIN) 5-325 MG tablet  Every 6 hours PRN        08/28/21 0132              Antony Madura, PA-C 08/28/21 0411    Long, Arlyss Repress, MD 08/29/21 [phone]

## 2021-08-28 NOTE — Discharge Instructions (Addendum)
Keep your splint on at all times.  Do not remove it and be sure that it remains clean and dry.  Use crutches to prevent from putting weight on your left foot.  You have been given a referral to orthopedics.  Call in the morning to schedule a follow-up visit in 1 week to be seen in the office to ensure proper healing of your broken bone.  You may take Norco as prescribed for management of severe pain.  Pain may be improved with elevation of the foot.

## 2022-01-27 IMAGING — CR DG FOOT COMPLETE 3+V*L*
3 series · 3 of 3 positions shown · non-contrast
Comparison: None.

CLINICAL DATA: Foot pain.

EXAM:
LEFT FOOT - COMPLETE 3+ VIEW

[x foot ap left]
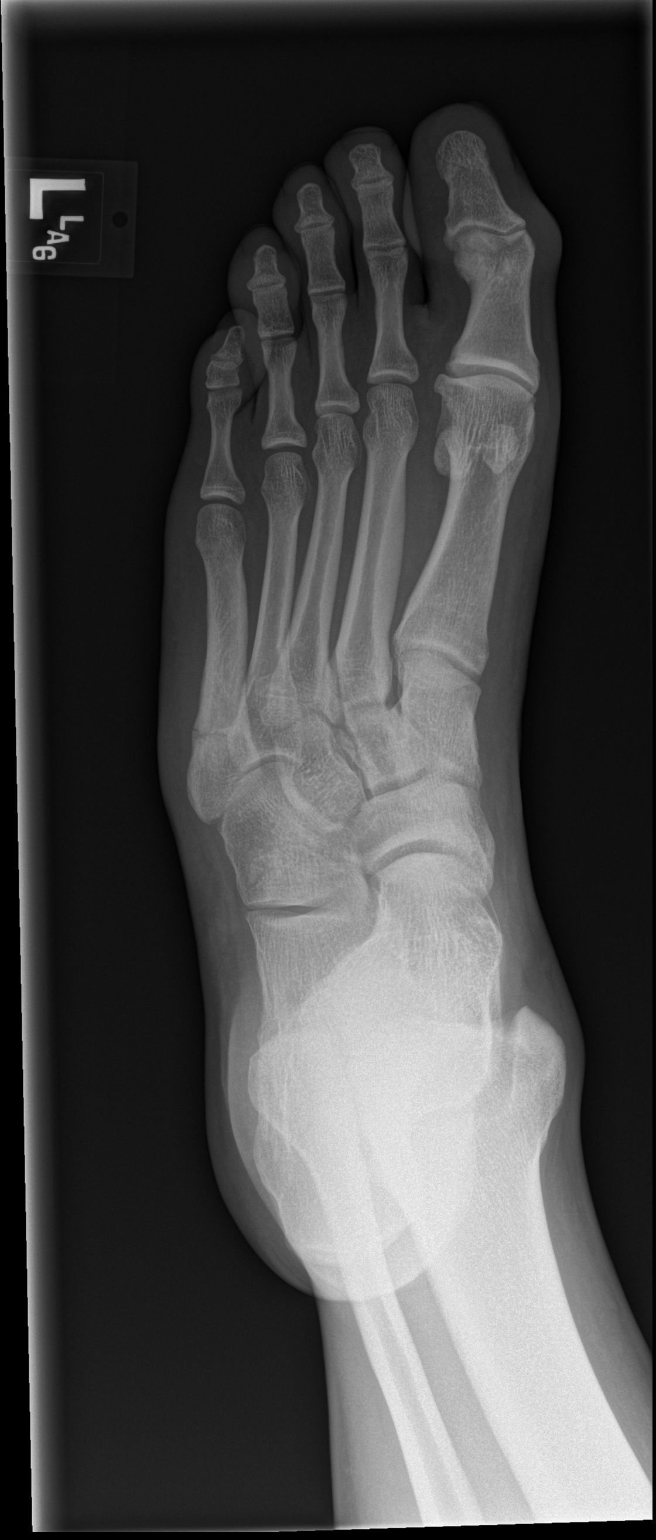

[x foot obl left]
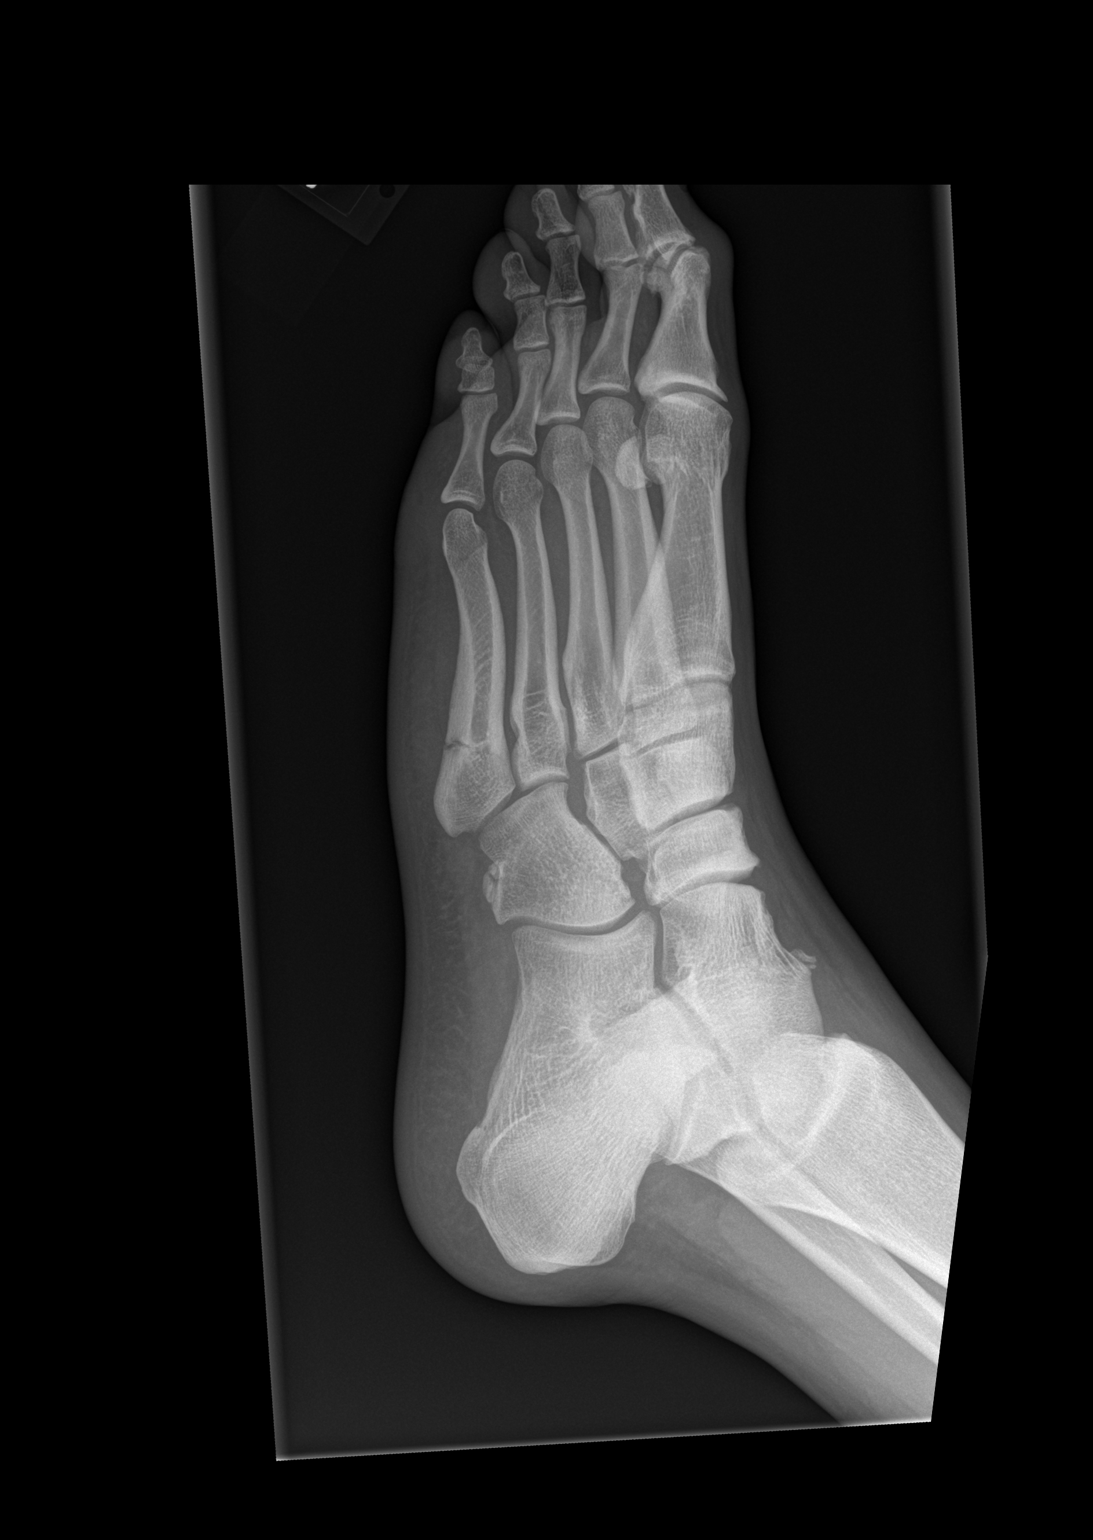

[x foot lat left]
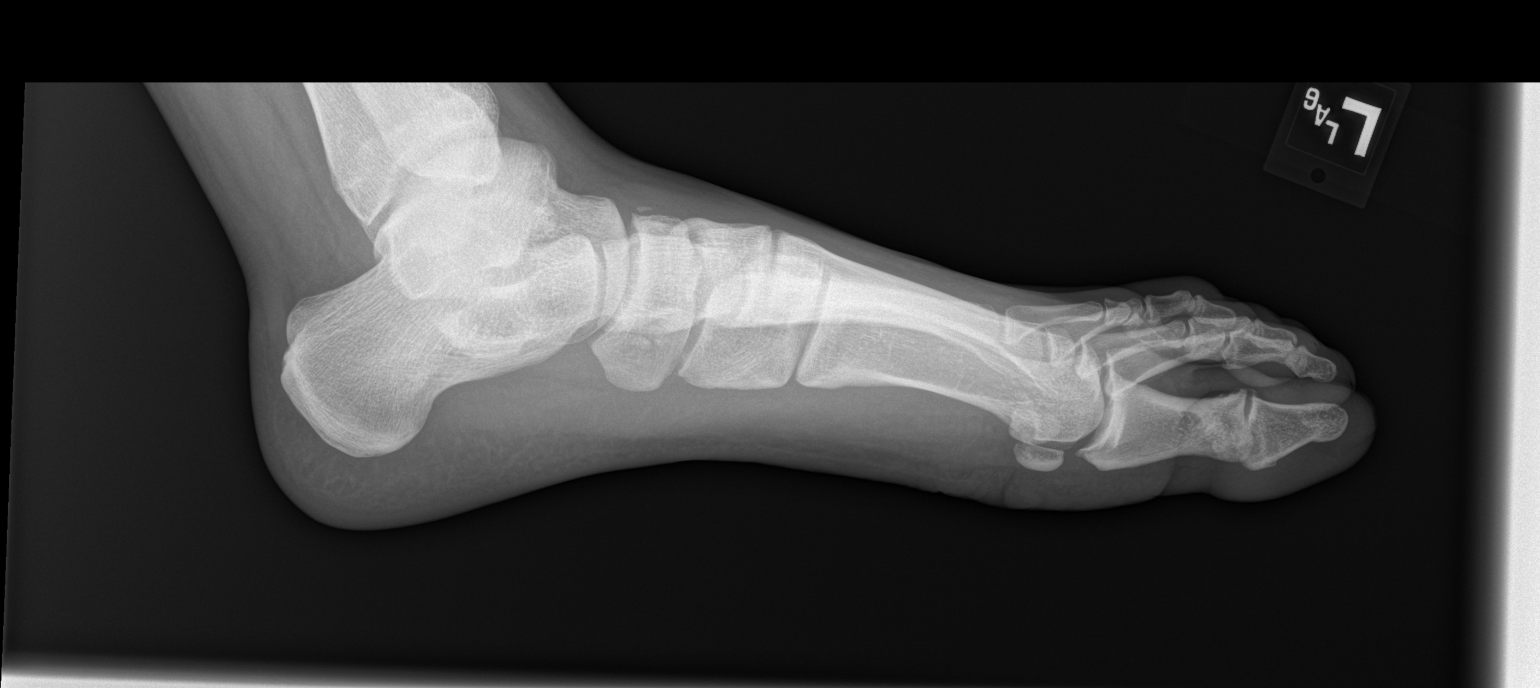

[3 of 3 positions shown; findings below may reference images not displayed]

FINDINGS: There is an acute transverse nondisplaced fracture through the
proximal aspect of the fifth metatarsal. No dislocation. Joint
spaces are well maintained. There is soft tissue swelling overlying
the fracture. There are mild degenerative changes at the first
metatarsophalangeal joint.
IMPRESSION: Acute nondisplaced fracture proximal fifth metatarsal.

## 2022-09-01 ENCOUNTER — Other Ambulatory Visit: Payer: Self-pay

## 2022-09-01 ENCOUNTER — Emergency Department (HOSPITAL_COMMUNITY)
Admission: EM | Admit: 2022-09-01 | Discharge: 2022-09-01 | Disposition: A | Discharge status: Home / Self Care | Payer: Medicaid Other | Attending: Emergency Medicine | Admitting: Emergency Medicine

## 2022-09-01 ENCOUNTER — Encounter (HOSPITAL_COMMUNITY): Payer: Self-pay

## 2022-09-01 ENCOUNTER — Emergency Department (HOSPITAL_COMMUNITY): Payer: Medicaid Other

## 2022-09-01 DIAGNOSIS — X58XXXA Exposure to other specified factors, initial encounter: Secondary | ICD-10-CM | POA: Insufficient documentation

## 2022-09-01 DIAGNOSIS — S92354A Nondisplaced fracture of fifth metatarsal bone, right foot, initial encounter for closed fracture: Secondary | ICD-10-CM | POA: Diagnosis not present

## 2022-09-01 DIAGNOSIS — S99921A Unspecified injury of right foot, initial encounter: Secondary | ICD-10-CM | POA: Diagnosis present

## 2022-09-01 DIAGNOSIS — Y9367 Activity, basketball: Secondary | ICD-10-CM | POA: Diagnosis not present

## 2022-09-01 MED ORDER — ACETAMINOPHEN 500 MG PO TABS
1000.0000 mg | ORAL_TABLET | Freq: Once | ORAL | Status: AC
  Administered 2022-09-01: 1000 mg via ORAL
  Filled 2022-09-01: qty 2

## 2022-09-01 NOTE — Discharge Instructions (Addendum)
You have a fracture on the fifth toe.  You will need to follow-up with podiatry, information above.  Wear the cam walking boot for the next 2 weeks.  If you develop severe pain in the foot, loss of sensation, fevers or new or concerning symptoms you should return back to the ED for evaluation.

## 2022-09-01 NOTE — ED Provider Notes (Signed)
[facility] DE[facility] Provider Note   CSN: [id number] Arrival date & time: 09/01/22  1627     History  Chief Complaint  Patient presents with   Foot Injury    Roberto Castro is a 27 y.o. male.   Foot Injury    Patient presents to the emergency department due to right foot pain.  This happened yesterday after landing on it wrong while playing basketball.  The pain is to the fifth digit of his right foot, he had a similar injury to the other foot about a year ago.  He is able to ambulate on it but endorses pain, no paresthesias or fevers.  Home Medications Prior to Admission medications   Medication Sig Start Date End Date Taking? Authorizing Provider  HYDROcodone-acetaminophen (NORCO/VICODIN) 5-325 MG tablet Take 1 tablet by mouth every 6 (six) hours as needed for severe pain. 08/28/21   Antonietta Breach, PA-C  ondansetron (ZOFRAN) 4 MG tablet Take 1 tablet (4 mg total) by mouth every 8 (eight) hours as needed for nausea or vomiting. Patient not taking: Reported on 11/26/2017 08/14/16   Melony Overly, MD      Allergies    Benadryl [diphenhydramine] and Ibuprofen    Review of Systems   Review of Systems  Physical Exam Updated Vital Signs BP (!) 134/103 (BP Location: Right Arm)   Pulse 93   Temp 98.5 F (36.9 C) (Oral)   Resp 16   Ht 6\' 3"  (1.905 m)   Wt 97.5 kg   SpO2 92%   BMI 26.87 kg/m  Physical Exam Vitals and nursing note reviewed. Exam conducted with a chaperone present.  Constitutional:      General: He is not in acute distress.    Appearance: Normal appearance.  HENT:     Head: Normocephalic and atraumatic.  Eyes:     General: No scleral icterus.    Extraocular Movements: Extraocular movements intact.     Pupils: Pupils are equal, round, and reactive to light.  Cardiovascular:     Pulses: Normal pulses.  Musculoskeletal:        General: Tenderness present.     Comments: Tenderness over the fifth metatarsal, able to move toes,  ankle on command.  No crepitus.  Skin:    Capillary Refill: Capillary refill takes less than 2 seconds.     Coloration: Skin is not jaundiced.  Neurological:     Mental Status: He is alert. Mental status is at baseline.     Coordination: Coordination normal.     ED Results / Procedures / Treatments   Labs (all labs ordered are listed, but only abnormal results are displayed) Labs Reviewed - No data to display  EKG None  Radiology DG Foot Complete Right  Result Date: 09/01/2022 CLINICAL DATA:  Injury to the right foot yesterday while playing basketball. Swelling. EXAM: RIGHT FOOT COMPLETE - 3+ VIEW; RIGHT ANKLE - COMPLETE 3+ VIEW COMPARISON:  Right ankle 01/07/2013 FINDINGS: Three views of the right foot and three views of the right ankle are obtained. There is a transverse nondisplaced fracture of the proximal right fifth metatarsal shaft with some overlying soft tissue swelling. Bone fragment demonstrated inferior to the cuboidal bone may represent an additional avulsion fracture. The right ankle appears otherwise intact. IMPRESSION: 1. Transverse fracture of the proximal right fifth metatarsal shaft with overlying soft tissue swelling. 2. Osseous fragment inferior to the cuboidal bone likely representing an avulsion fragment. Electronically Signed   By: Lucienne Capers  M.D.   On: 09/01/2022 17:34   DG Ankle Complete Right  Result Date: 09/01/2022 CLINICAL DATA:  Injury to the right foot yesterday while playing basketball. Swelling. EXAM: RIGHT FOOT COMPLETE - 3+ VIEW; RIGHT ANKLE - COMPLETE 3+ VIEW COMPARISON:  Right ankle 01/07/2013 FINDINGS: Three views of the right foot and three views of the right ankle are obtained. There is a transverse nondisplaced fracture of the proximal right fifth metatarsal shaft with some overlying soft tissue swelling. Bone fragment demonstrated inferior to the cuboidal bone may represent an additional avulsion fracture. The right ankle appears otherwise  intact. IMPRESSION: 1. Transverse fracture of the proximal right fifth metatarsal shaft with overlying soft tissue swelling. 2. Osseous fragment inferior to the cuboidal bone likely representing an avulsion fragment. Electronically Signed   By: Lucienne Capers M.D.   On: 09/01/2022 17:34    Procedures Procedures    Medications Ordered in ED Medications - No data to display  ED Course/ Medical Decision Making/ A&P                             Medical Decision Making Amount and/or Complexity of Data Reviewed Radiology: ordered.   Patient presents to the emergency department due to right foot pain.  Differential includes but not limited to fractures, dislocations, compartment syndrome tendon or ligamental injury.  On exam patient is neurovascular intact with brisk cap refill and DP and PT are palpable.  He has tenderness over the fifth metatarsal but no crepitus.  Will start with x-rays.  Plain film is notable for fifth metatarsal fracture, discussed workup with patient.  Patient is requesting cam walking boot, discussed with my attending we will proceed with that instead of posterior splint.  Weightbearing as needed, podiatry follow-up provided.  Return precaution discussed with patient who verbalized understanding and agreement with the plan.        Final Clinical Impression(s) / ED Diagnoses Final diagnoses:  Nondisplaced fracture of fifth metatarsal bone, right foot, initial encounter for closed fracture    Rx / DC Orders ED Discharge Orders     None         Sherrill Raring, PA-C 09/01/22 1804    Sherwood Gambler, MD 09/01/22 2104

## 2022-09-01 NOTE — ED Triage Notes (Signed)
Pt states he injured his right foot yesterday while playing basketball. Swelling noted, cms intact.

## 2023-06-11 ENCOUNTER — Emergency Department (HOSPITAL_COMMUNITY): Payer: 59

## 2023-06-11 ENCOUNTER — Emergency Department (HOSPITAL_COMMUNITY)
Admission: EM | Admit: 2023-06-11 | Discharge: 2023-06-12 | Disposition: A | Discharge status: Home / Self Care | Payer: 59 | Attending: Student | Admitting: Student

## 2023-06-11 ENCOUNTER — Encounter (HOSPITAL_COMMUNITY): Payer: Self-pay

## 2023-06-11 ENCOUNTER — Other Ambulatory Visit: Payer: Self-pay

## 2023-06-11 DIAGNOSIS — J45909 Unspecified asthma, uncomplicated: Secondary | ICD-10-CM | POA: Insufficient documentation

## 2023-06-11 DIAGNOSIS — M19071 Primary osteoarthritis, right ankle and foot: Secondary | ICD-10-CM | POA: Diagnosis not present

## 2023-06-11 DIAGNOSIS — M79671 Pain in right foot: Secondary | ICD-10-CM | POA: Diagnosis not present

## 2023-06-11 DIAGNOSIS — S92901A Unspecified fracture of right foot, initial encounter for closed fracture: Secondary | ICD-10-CM | POA: Diagnosis not present

## 2023-06-11 MED ORDER — NAPROXEN 500 MG PO TABS
500.0000 mg | ORAL_TABLET | Freq: Once | ORAL | Status: AC
  Administered 2023-06-12: 500 mg via ORAL
  Filled 2023-06-11: qty 1

## 2023-06-11 MED ORDER — ACETAMINOPHEN 500 MG PO TABS: 1000.0000 mg | ORAL_TABLET | Freq: Three times a day (TID) | ORAL | 0 refills | Status: AC

## 2023-06-11 MED ORDER — NAPROXEN 375 MG PO TABS: 375.0000 mg | ORAL_TABLET | Freq: Two times a day (BID) | ORAL | 0 refills | Status: AC

## 2023-06-11 NOTE — ED Triage Notes (Signed)
Pt arrived POV d/t lateral right foot pain - atraumatic.  No swelling noted but does stand at work all day Engineer, [employer].

## 2023-06-12 DIAGNOSIS — M79671 Pain in right foot: Secondary | ICD-10-CM | POA: Diagnosis not present

## 2023-06-12 NOTE — ED Provider Notes (Signed)
[place] EMERGENCY DEPARTMENT AT [facility] Provider Note  CSN: [id number] Arrival date & time: 06/11/23 1928  Chief Complaint(s) Foot Pain  HPI Roberto Castro is a 27 y.o. male with PMH asthma, previous foot fractures who presents emergency room for evaluation of right foot pain.  Patient states that approximately 1 year ago he suffered a fracture and his right foot and has overall been doing well up until the last 3 days.  He states that over these last 3 days his pain has significant worsened.  He got new boots at work and is unsure if this may be contributing.  Patient lays concrete for work.   Past Medical History Past Medical History:  Diagnosis Date   Asthma    Seasonal allergies    There are no problems to display for this patient.  Home Medication(s) Prior to Admission medications   Medication Sig Start Date End Date Taking? Authorizing Provider  acetaminophen (TYLENOL) 500 MG tablet Take 2 tablets (1,000 mg total) by mouth every 8 (eight) hours. 06/11/23 07/11/23 Yes Jayma Volpi, MD  naproxen (NAPROSYN) 375 MG tablet Take 1 tablet (375 mg total) by mouth 2 (two) times daily. 06/11/23  Yes Mckaila Duffus, MD  HYDROcodone-acetaminophen (NORCO/VICODIN) 5-325 MG tablet Take 1 tablet by mouth every 6 (six) hours as needed for severe pain. 08/28/21   Antony Madura, PA-C  ondansetron (ZOFRAN) 4 MG tablet Take 1 tablet (4 mg total) by mouth every 8 (eight) hours as needed for nausea or vomiting. Patient not taking: Reported on 11/26/2017 08/14/16   Charm Rings, MD                                                                                                                                    Past Surgical History History reviewed. No pertinent surgical history. Family History Family History  Family history unknown: Yes    Social History Social History   Tobacco Use   Smoking status: Never   Smokeless tobacco: Never  Vaping Use   Vaping status: Never  Used  Substance Use Topics   Alcohol use: Yes    Comment: occ   Drug use: Yes    Types: Marijuana    Comment: sometimes    Allergies Benadryl [diphenhydramine] and Ibuprofen  Review of Systems Review of Systems  Musculoskeletal:  Positive for arthralgias.    Physical Exam Vital Signs  I have reviewed the triage vital signs BP (!) 163/106 (BP Location: Right Arm)   Pulse 88   Temp 98.1 F (36.7 C) (Oral)   Resp 18   Ht 6\' 3"  (1.905 m)   Wt 102.1 kg   SpO2 100%   BMI 28.12 kg/m   Physical Exam Constitutional:      General: He is not in acute distress.    Appearance: Normal appearance.  HENT:     Head: Normocephalic and atraumatic.     Nose:  No congestion or rhinorrhea.  Eyes:     General:        Right eye: No discharge.        Left eye: No discharge.     Extraocular Movements: Extraocular movements intact.     Pupils: Pupils are equal, round, and reactive to light.  Cardiovascular:     Rate and Rhythm: Normal rate and regular rhythm.     Heart sounds: No murmur heard. Pulmonary:     Effort: No respiratory distress.     Breath sounds: No wheezing or rales.  Abdominal:     General: There is no distension.     Tenderness: There is no abdominal tenderness.  Musculoskeletal:        General: Swelling and tenderness present. Normal range of motion.     Cervical back: Normal range of motion.  Skin:    General: Skin is warm and dry.  Neurological:     General: No focal deficit present.     Mental Status: He is alert.     ED Results and Treatments Labs (all labs ordered are listed, but only abnormal results are displayed) Labs Reviewed - No data to display                                                                                                                        Radiology DG Foot Complete Right  Result Date: 06/11/2023 CLINICAL DATA:  Right lateral foot pain EXAM: RIGHT FOOT COMPLETE - 3+ VIEW COMPARISON:  09/01/2022 FINDINGS: Cortical bone  thickening and sclerosis with vague lucency at the proximal fifth metatarsal at the site of prior fracture. No new fracture abnormality is seen. Mild degenerative change at the first MTP joint. Chronic os effect densities adjacent to the cuboid. IMPRESSION: Mild cortical bone thickening base of fifth metatarsal with some vague lucency in the region, this is in the region of previously noted nondisplaced fracture, question incomplete healing versus interval subacute/healing stress fracture. Electronically Signed   By: Jasmine Pang M.D.   On: 06/11/2023 21:01    Pertinent labs & imaging results that were available during my care of the patient were reviewed by me and considered in my medical decision making (see MDM for details).  Medications Ordered in ED Medications  naproxen (NAPROSYN) tablet 500 mg (500 mg Oral Given 06/12/23 0005)  Procedures Procedures  (including critical care time)  Medical Decision Making / ED Course   This patient presents to the ED for concern of foot pain, this involves an extensive number of treatment options, and is a complaint that carries with it a high risk of complications and morbidity.  The differential diagnosis includes fracture, ligamentous injury, hematoma, contusion, local irritation  MDM: Patient seen emergency room for evaluation of foot pain.  Physical exam with tenderness at the lateral aspect of the right foot near the fifth metatarsal.  X-ray imaging showing mild cortical bone thickening at the base of the fifth metatarsal with some lucency in the region at the site of a previous nondisplaced fracture with concern for incomplete healing versus subacute/stress fracture.  Patient placed in a cam boot and given Naprosyn for pain control.  Outpatient podiatry referral placed.  At this time he does not meet inpatient criteria  for admission he is safe for discharge with outpatient podiatry follow-up.   Additional history obtained: -Additional history obtained from fianc -External records from outside source obtained and reviewed including: Chart review including previous notes, labs, imaging, consultation notes    Imaging Studies ordered: I ordered imaging studies including foot x-ray I independently visualized and interpreted imaging. I agree with the radiologist interpretation   Medicines ordered and prescription drug management: Meds ordered this encounter  Medications   naproxen (NAPROSYN) tablet 500 mg   naproxen (NAPROSYN) 375 MG tablet    Sig: Take 1 tablet (375 mg total) by mouth 2 (two) times daily.    Dispense:  20 tablet    Refill:  0   acetaminophen (TYLENOL) 500 MG tablet    Sig: Take 2 tablets (1,000 mg total) by mouth every 8 (eight) hours.    Dispense:  180 tablet    Refill:  0    -I have reviewed the patients home medicines and have made adjustments as needed  Critical interventions none   Social Determinants of Health:  Factors impacting patients care include: Lays concrete for work   Reevaluation: After the interventions noted above, I reevaluated the patient and found that they have :improved  Co morbidities that complicate the patient evaluation  Past Medical History:  Diagnosis Date   Asthma    Seasonal allergies       Dispostion: I considered admission for this patient, but at this time he does not meet inpatient criteria for admission and he is safe for discharge with outpatient follow-up     Final Clinical Impression(s) / ED Diagnoses Final diagnoses:  Foot pain, right     @PCDICTATION @    Jullien Granquist, Wyn Forster, MD 06/12/23 [phone]
